# Patient Record
Sex: Male | Born: 1970 | Race: White | Hispanic: No | Marital: Single | Smoking: Current every day smoker
Health system: Southern US, Community
[De-identification: ages and names within clinical notes are randomized; demographics above are authoritative.]

---

## 2020-08-11 ENCOUNTER — Other Ambulatory Visit: Payer: Self-pay

## 2020-08-11 ENCOUNTER — Emergency Department (HOSPITAL_COMMUNITY): Payer: Federal, State, Local not specified - PPO

## 2020-08-11 ENCOUNTER — Emergency Department (HOSPITAL_COMMUNITY)
Admission: EM | Admit: 2020-08-11 | Discharge: 2020-08-11 | Disposition: A | Discharge status: Home / Self Care | Payer: Federal, State, Local not specified - PPO | Attending: Emergency Medicine | Admitting: Emergency Medicine

## 2020-08-11 DIAGNOSIS — R6884 Jaw pain: Secondary | ICD-10-CM | POA: Insufficient documentation

## 2020-08-11 DIAGNOSIS — N200 Calculus of kidney: Secondary | ICD-10-CM | POA: Insufficient documentation

## 2020-08-11 DIAGNOSIS — Z113 Encounter for screening for infections with a predominantly sexual mode of transmission: Secondary | ICD-10-CM | POA: Diagnosis not present

## 2020-08-11 DIAGNOSIS — M279 Disease of jaws, unspecified: Secondary | ICD-10-CM

## 2020-08-11 DIAGNOSIS — Z7689 Persons encountering health services in other specified circumstances: Secondary | ICD-10-CM

## 2020-08-11 LAB — URINALYSIS, ROUTINE W REFLEX MICROSCOPIC
Bilirubin Urine: NEGATIVE
Glucose, UA: NEGATIVE mg/dL
Hgb urine dipstick: NEGATIVE
Ketones, ur: NEGATIVE mg/dL
Leukocytes,Ua: NEGATIVE
Nitrite: NEGATIVE
Protein, ur: NEGATIVE mg/dL
Specific Gravity, Urine: 1.019 (ref 1.005–1.030)
pH: 5 (ref 5.0–8.0)

## 2020-08-11 LAB — CBC WITH DIFFERENTIAL/PLATELET
Abs Immature Granulocytes: 0.02 10*3/uL (ref 0.00–0.07)
Basophils Absolute: 0 10*3/uL (ref 0.0–0.1)
Basophils Relative: 1 %
Eosinophils Absolute: 0.1 10*3/uL (ref 0.0–0.5)
Eosinophils Relative: 1 %
HCT: 46.2 % (ref 39.0–52.0)
Hemoglobin: 15.6 g/dL (ref 13.0–17.0)
Immature Granulocytes: 0 %
Lymphocytes Relative: 27 %
Lymphs Abs: 2.4 10*3/uL (ref 0.7–4.0)
MCH: 29.4 pg (ref 26.0–34.0)
MCHC: 33.8 g/dL (ref 30.0–36.0)
MCV: 87.2 fL (ref 80.0–100.0)
Monocytes Absolute: 0.8 10*3/uL (ref 0.1–1.0)
Monocytes Relative: 9 %
Neutro Abs: 5.3 10*3/uL (ref 1.7–7.7)
Neutrophils Relative %: 62 %
Platelets: 241 10*3/uL (ref 150–400)
RBC: 5.3 MIL/uL (ref 4.22–5.81)
RDW: 12.8 % (ref 11.5–15.5)
WBC: 8.6 10*3/uL (ref 4.0–10.5)
nRBC: 0 % (ref 0.0–0.2)

## 2020-08-11 LAB — LIPASE, BLOOD: Lipase: 29 U/L (ref 11–51)

## 2020-08-11 LAB — COMPREHENSIVE METABOLIC PANEL
ALT: 14 U/L (ref 0–44)
AST: 19 U/L (ref 15–41)
Albumin: 4.2 g/dL (ref 3.5–5.0)
Alkaline Phosphatase: 71 U/L (ref 38–126)
Anion gap: 7 (ref 5–15)
BUN: 26 mg/dL — ABNORMAL HIGH (ref 6–20)
CO2: 27 mmol/L (ref 22–32)
Calcium: 9.1 mg/dL (ref 8.9–10.3)
Chloride: 102 mmol/L (ref 98–111)
Creatinine, Ser: 1.08 mg/dL (ref 0.61–1.24)
GFR, Estimated: >60 mL/min (ref >60)
Glucose, Bld: 100 mg/dL — ABNORMAL HIGH (ref 70–99)
Potassium: 3.7 mmol/L (ref 3.5–5.1)
Sodium: 136 mmol/L (ref 135–145)
Total Bilirubin: 0.7 mg/dL (ref 0.3–1.2)
Total Protein: 7.7 g/dL (ref 6.5–8.1)

## 2020-08-11 LAB — RPR: RPR Ser Ql: NONREACTIVE

## 2020-08-11 LAB — HIV ANTIBODY (ROUTINE TESTING W REFLEX): HIV Screen 4th Generation wRfx: NONREACTIVE

## 2020-08-11 IMAGING — CT CT RENAL STONE PROTOCOL
2 of 4 series · 16 of 46 positions shown, 18 images · non-contrast
Comparison: None.

CLINICAL DATA: Right-sided flank pain for 2 days with hematuria,
initial encounter

EXAM:
CT ABDOMEN AND PELVIS WITHOUT CONTRAST
TECHNIQUE: Multidetector CT imaging of the abdomen and pelvis was performed
following the standard protocol without IV contrast.

[Series 2: axial st · axial · 0.75mm/px · z∈[-901,-476]mm · 13 of 97 slices shown, 15 images]
[im 6/97  soft-tissue]
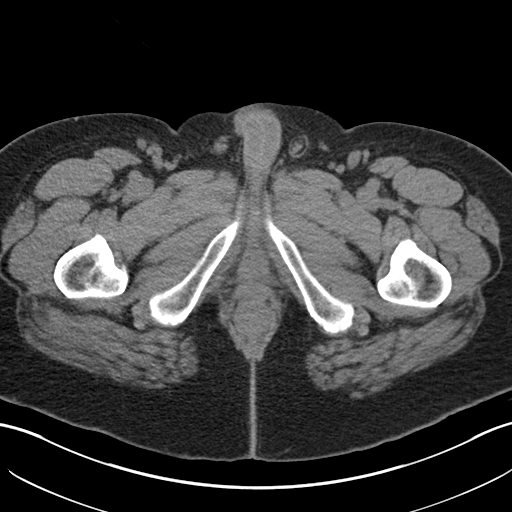
[im 6/97  bone]
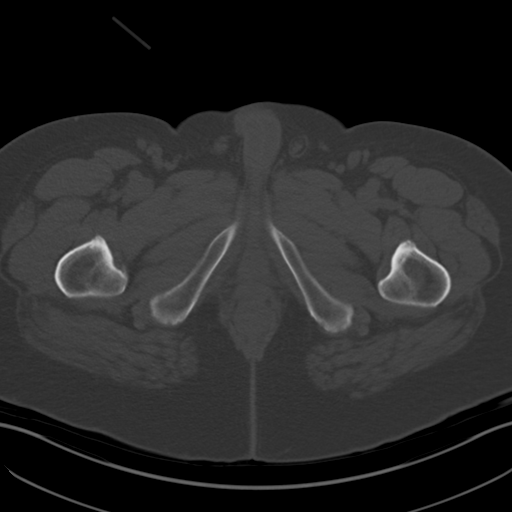
[im 16/97  soft-tissue]
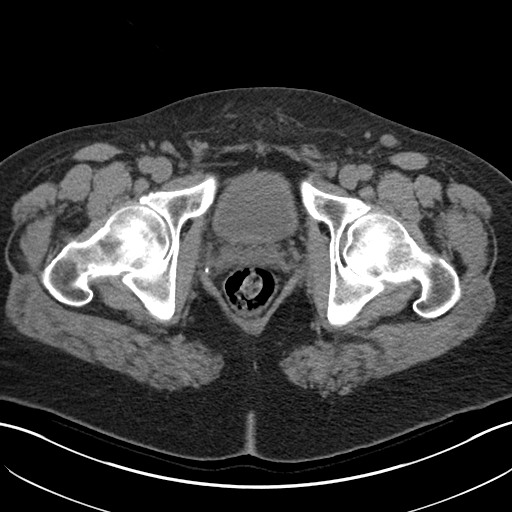
[im 21/97  soft-tissue]
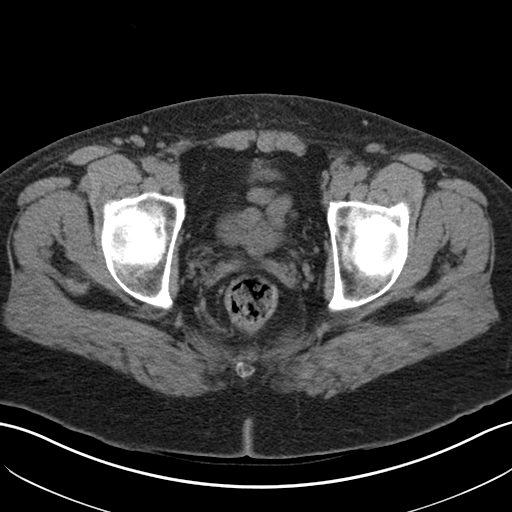
[im 26/97  soft-tissue]
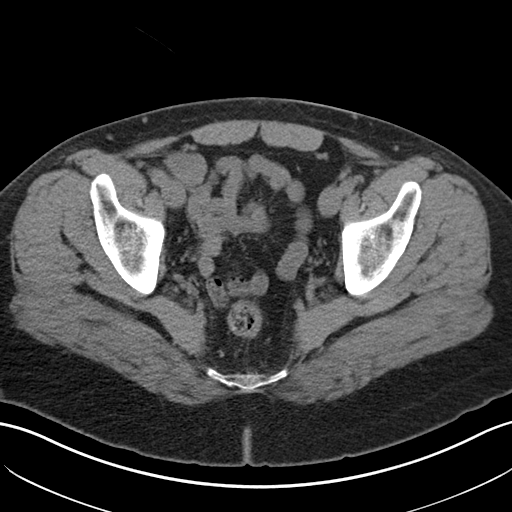
[im 36/97  soft-tissue]
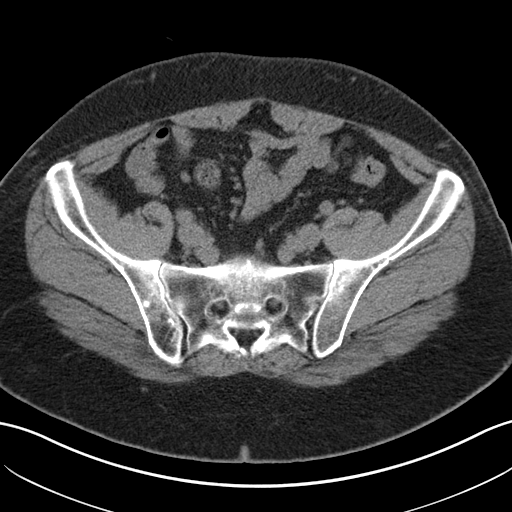
[im 41/97  soft-tissue]
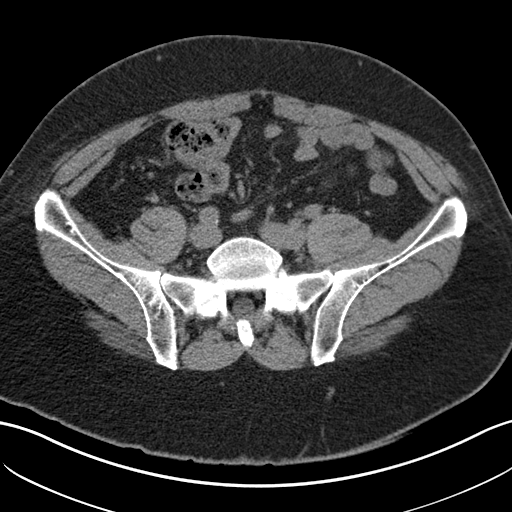
[im 51/97  soft-tissue]
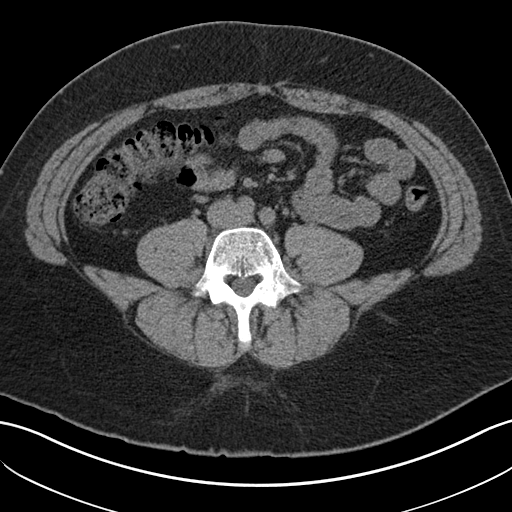
[im 56/97  soft-tissue]
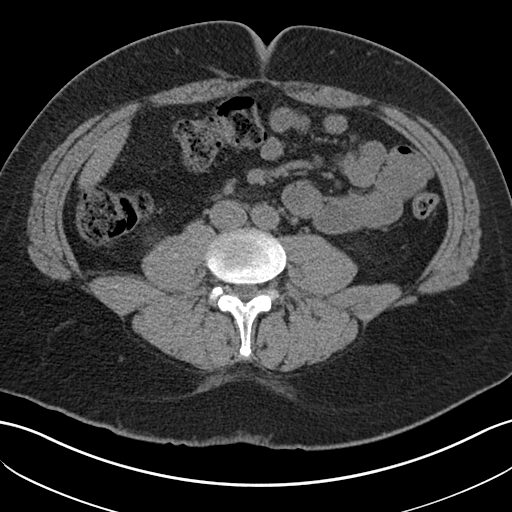
[im 61/97  soft-tissue]
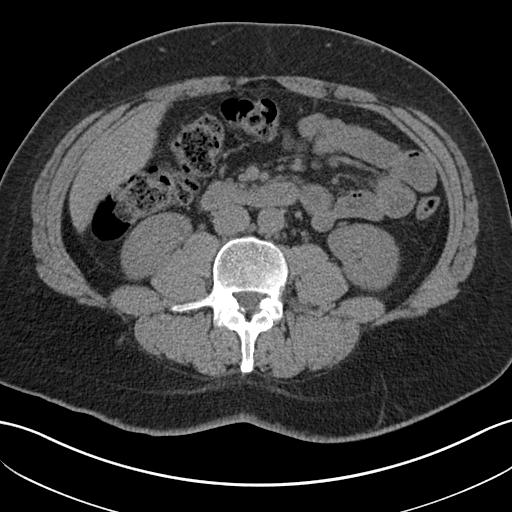
[im 61/97  bone]
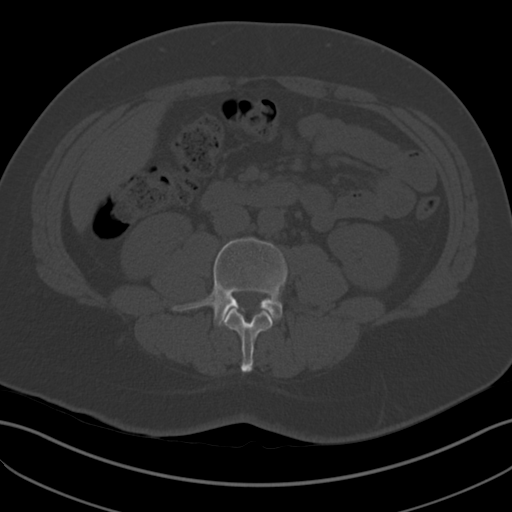
[im 71/97  soft-tissue]
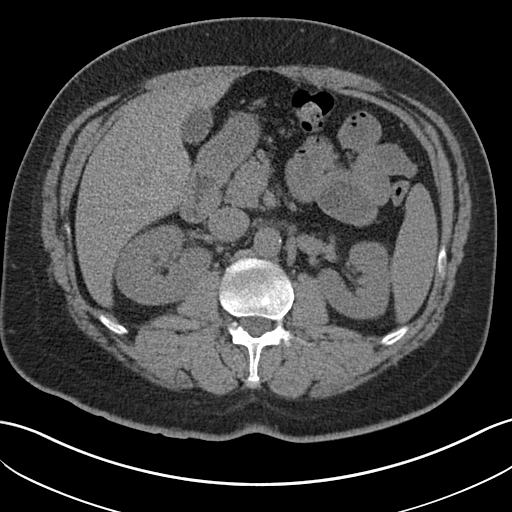
[im 76/97  soft-tissue]
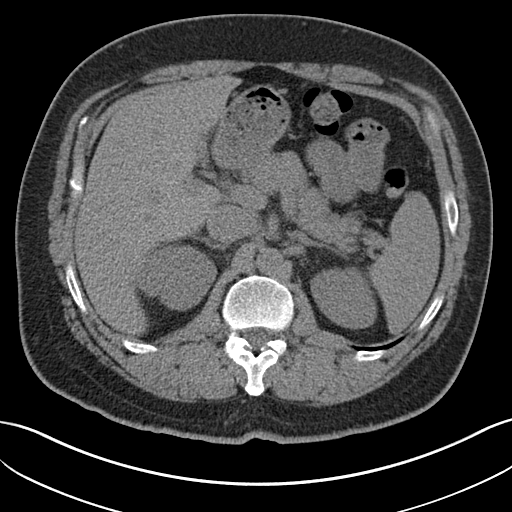
[im 81/97  soft-tissue]
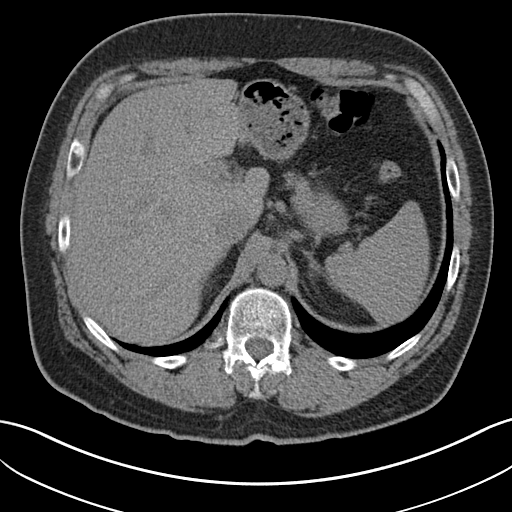
[im 91/97  soft-tissue]
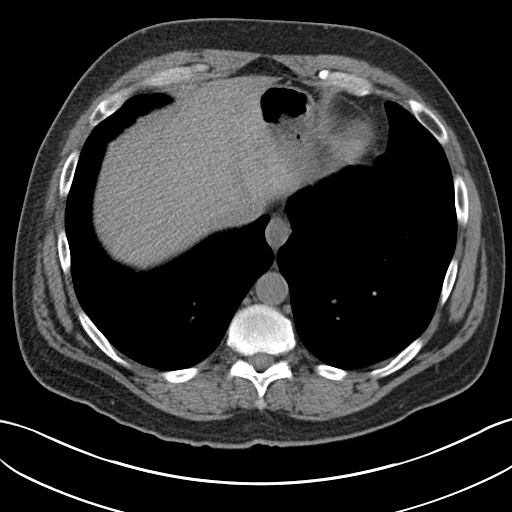

[Series 4: coronal · coronal · 0.81mm/px · 3 of 152 slices shown]
[im 51/152  soft-tissue]
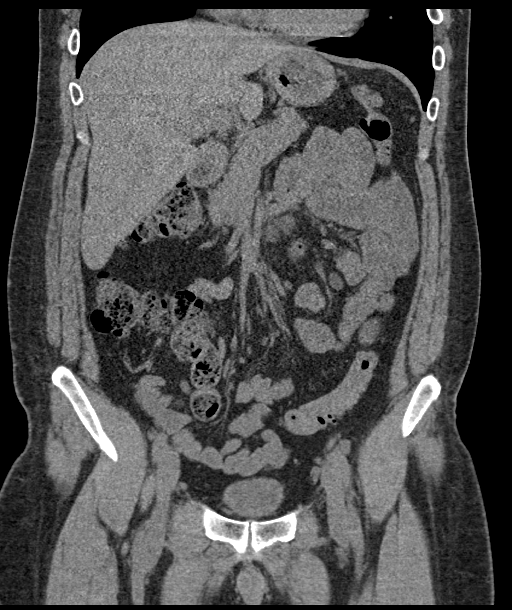
[im 68/152  soft-tissue]
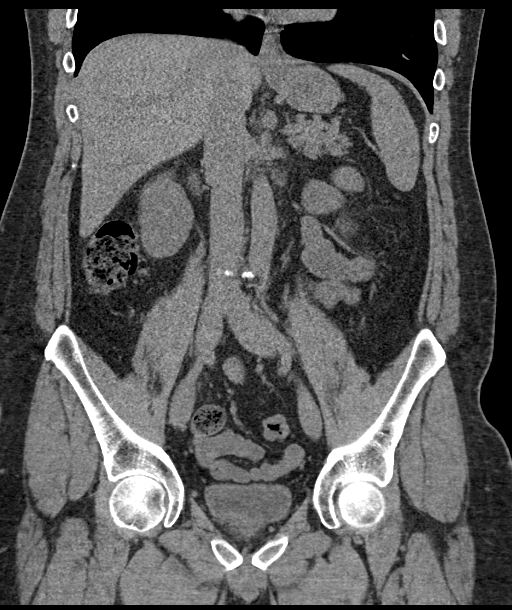
[im 84/152  soft-tissue]
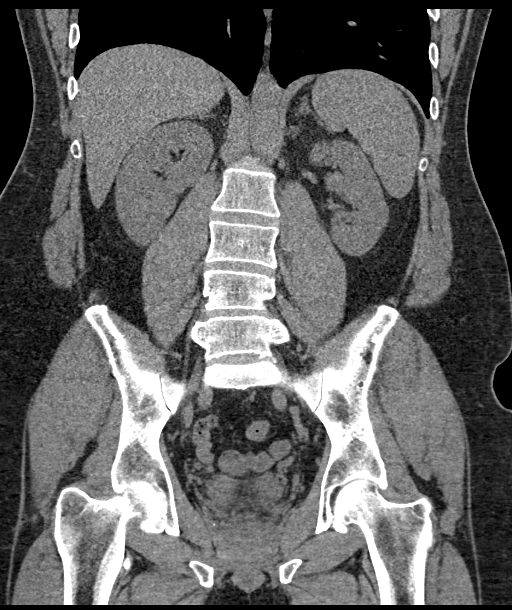

[16 of 46 positions shown; findings below may reference images not displayed]

FINDINGS: Lower chest: No acute abnormality.

Hepatobiliary: No focal liver abnormality is seen. No gallstones,
gallbladder wall thickening, or biliary dilatation.

Pancreas: Unremarkable. No pancreatic ductal dilatation or
surrounding inflammatory changes.

Spleen: Normal in size without focal abnormality.

Adrenals/Urinary Tract: Adrenal glands are within normal limits
bilaterally. Kidneys are well visualized with a tiny nonobstructing
stone on the left. Ureters are within normal limits. No obstructive
changes are seen. Bladder is partially distended.

Stomach/Bowel: Scattered diverticular change of the colon is noted
without evidence of diverticulitis. The appendix is well visualized.
Small bowel and stomach are within normal limits.

Vascular/Lymphatic: Mild atherosclerotic calcifications are noted.
No sizable lymphadenopathy is noted.

Reproductive: Prostate is well visualized. Metallic densities are
noted within likely related to prior uro lift procedure.

Other: No abdominal wall hernia or abnormality. No abdominopelvic
ascites.

Musculoskeletal: Degenerative changes of lumbar spine are noted. No
acute bony abnormality is seen.
IMPRESSION: Tiny nonobstructing left renal stone.

Diverticulosis without diverticulitis.

No acute abnormality noted.

## 2020-08-11 IMAGING — CT CT NECK W/O CM
3 of 4 series · 13 of 33 positions shown, 16 images · non-contrast
Comparison: None.

CLINICAL DATA: Deep tissue submandibular swelling, technologist
note states area neck that feels hard/swollen under chin

EXAM:
CT NECK WITHOUT CONTRAST
TECHNIQUE: Multidetector CT imaging of the neck was performed following the
standard protocol without intravenous contrast.

[Series 2: axial neck · axial · 0.51mm/px · z∈[-272,-106]mm · 5 of 125 slices shown, 7 images]
[im 21/125  soft-tissue]
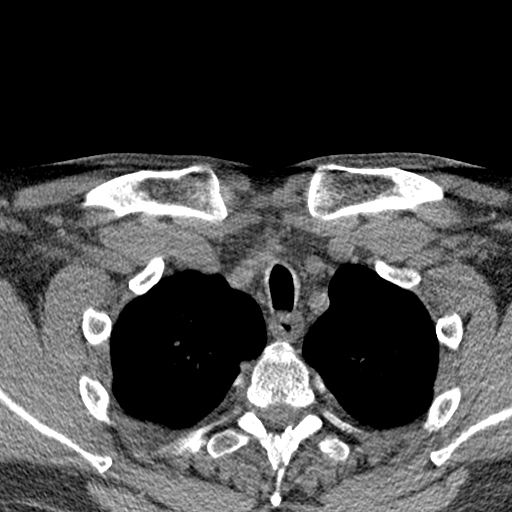
[im 21/125  bone]
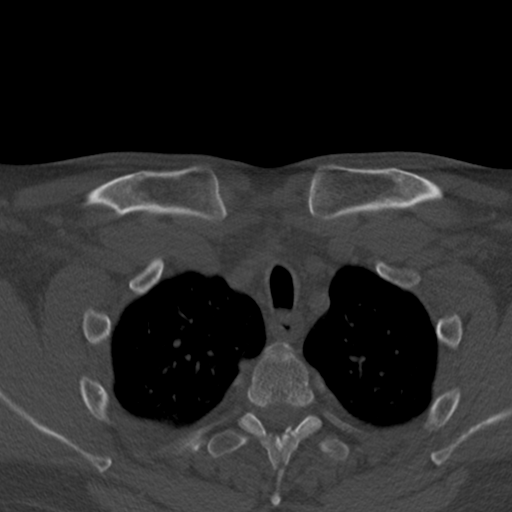
[im 42/125  bone]
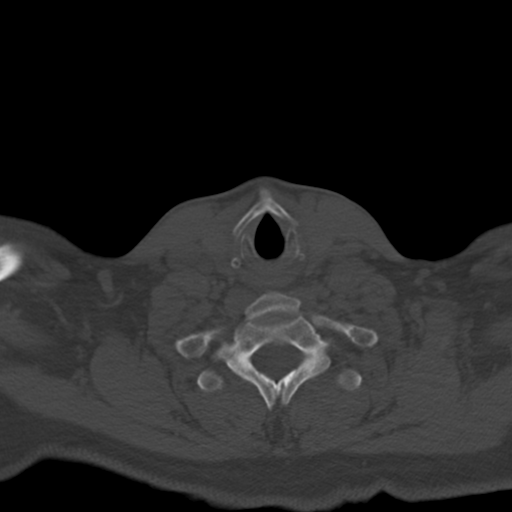
[im 63/125  bone]
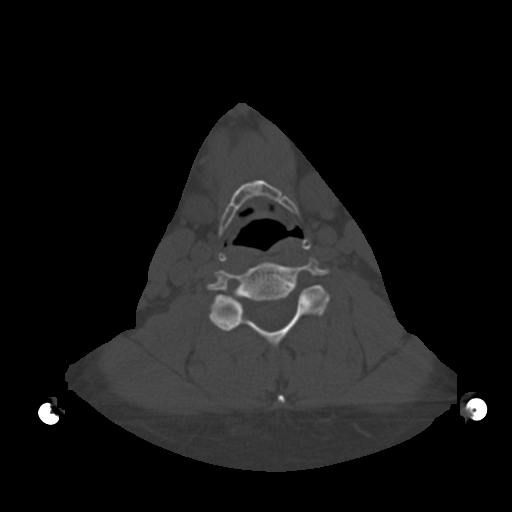
[im 83/125  bone]
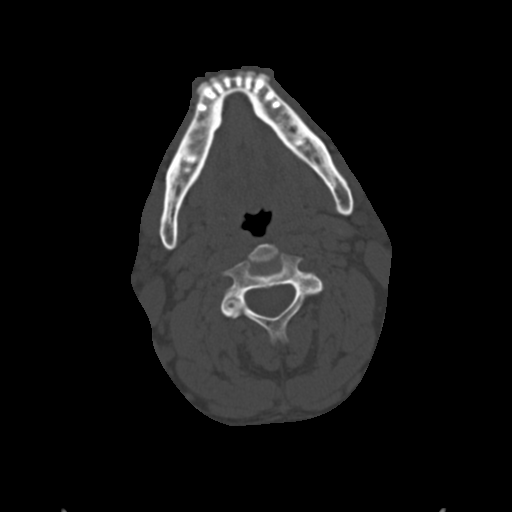
[im 104/125  soft-tissue]
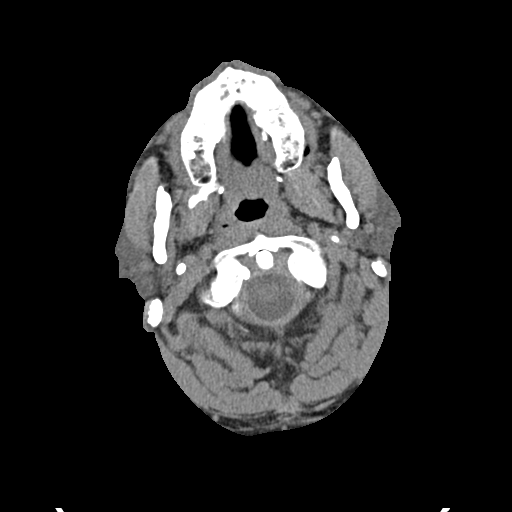
[im 104/125  bone]
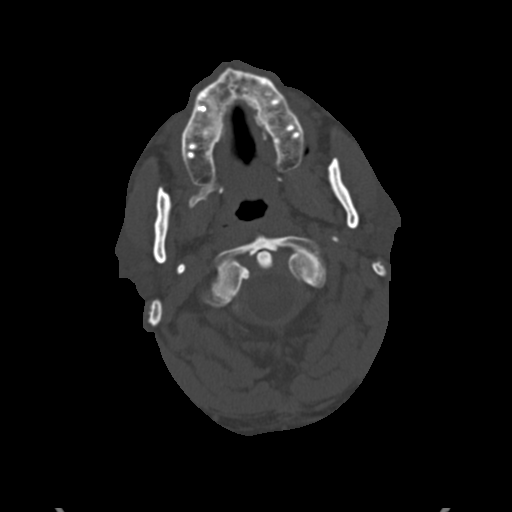

[Series 5: cor neck · coronal · 0.54mm/px · 3 of 99 slices shown]
[im 23/99  bone]
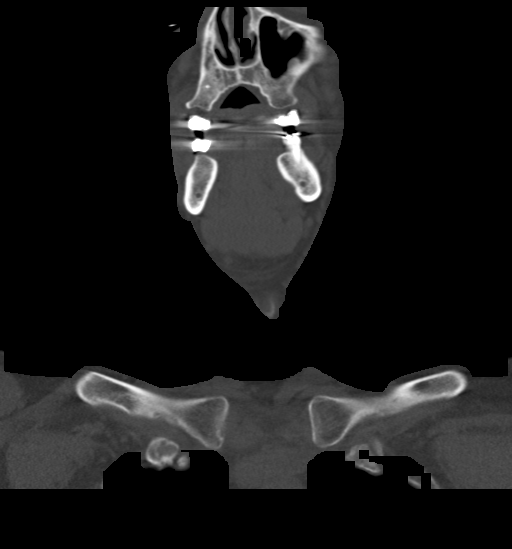
[im 41/99  bone]
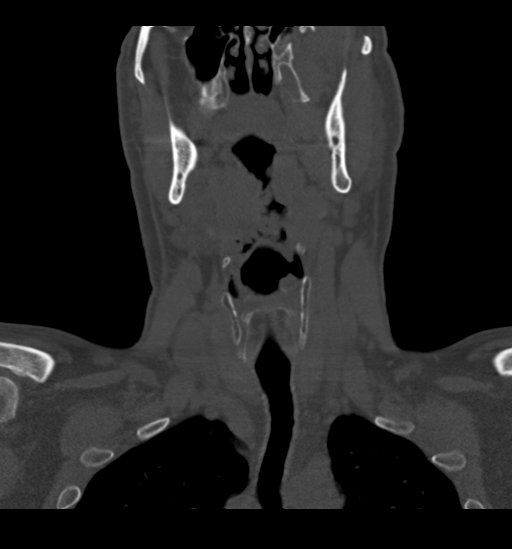
[im 58/99  bone]
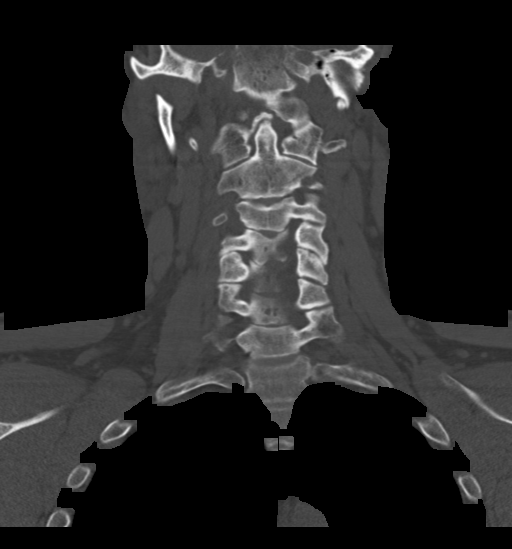

[Series 6: sag neck · sagittal · 0.38mm/px · 5 of 101 slices shown, 6 images]
[im 34/101  bone]
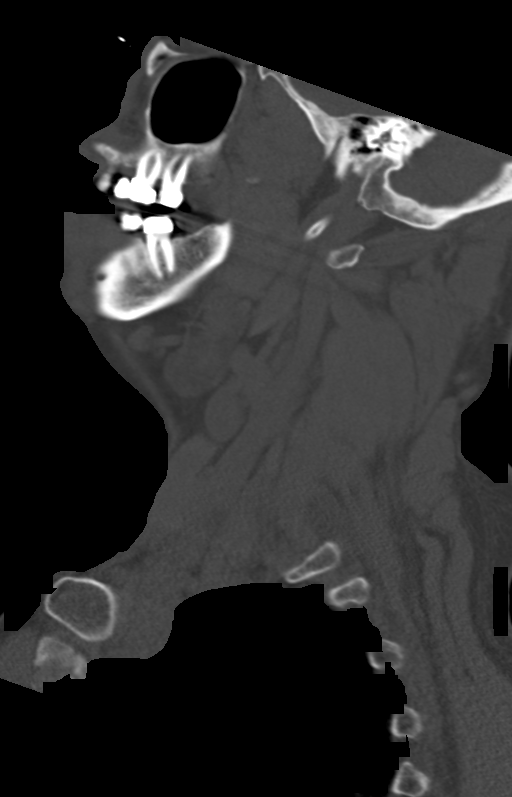
[im 42/101  bone]
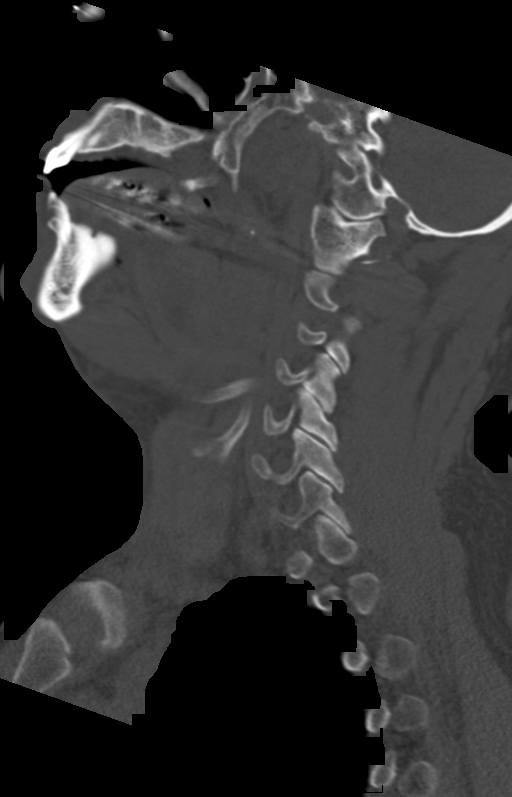
[im 51/101  soft-tissue]
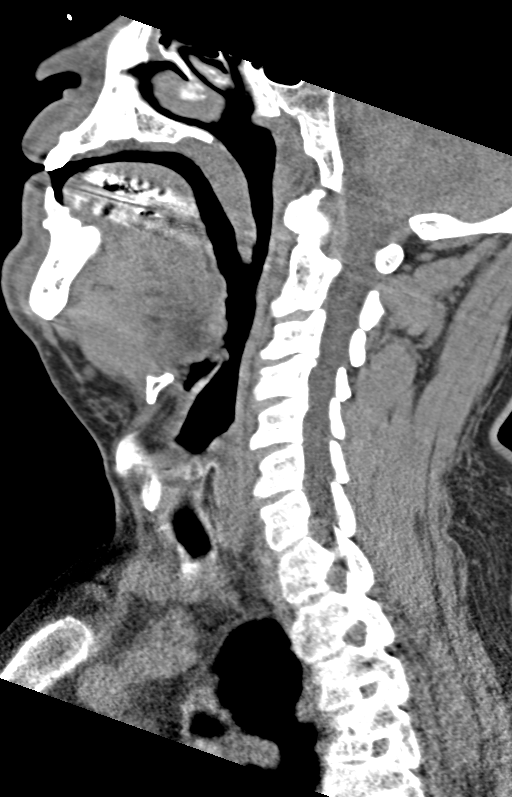
[im 51/101  bone]
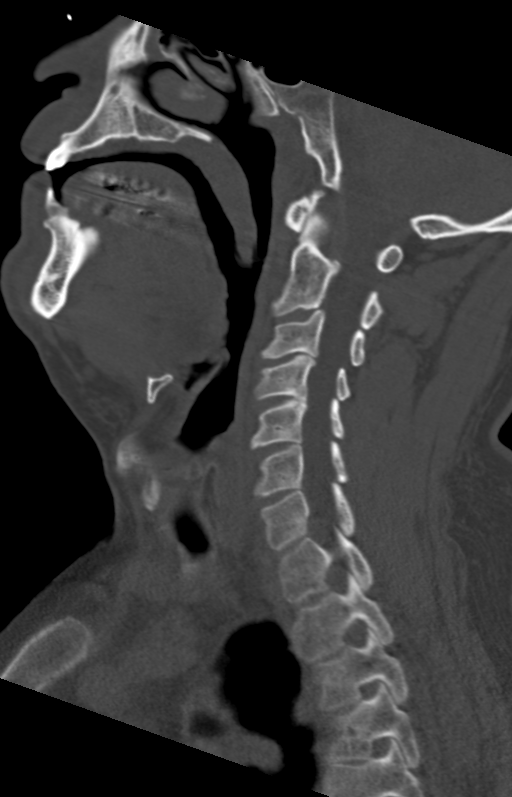
[im 59/101  bone]
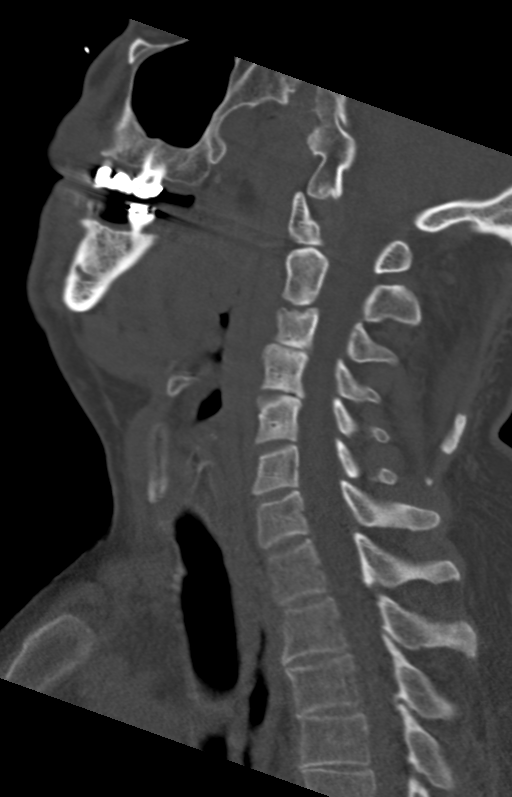
[im 67/101  bone]
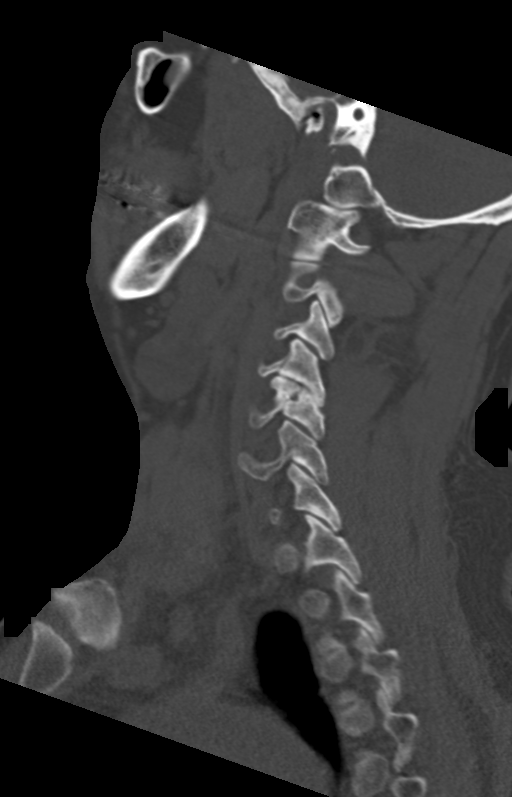

[13 of 33 positions shown; findings below may reference images not displayed]

FINDINGS: Pharynx and larynx: Unremarkable.

Salivary glands: Parotid and submandibular glands are unremarkable.

Thyroid: Normal.

Lymph nodes: No enlarged or abnormal density nodes.

Vascular: No significant abnormality on this noncontrast study.

Limited intracranial: Unremarkable.

Visualized orbits: Minimally imaged.

Mastoids and visualized paranasal sinuses: Minor left maxillary
sinus polypoid mucosal thickening. Visualized mastoids are clear.
There is a right stapes prosthesis.

Skeleton: Minor cervical spine degenerative changes.

Upper chest: Included upper lungs are clear.

Other: Skin marker overlies the chin. There is mild infiltration of
the submental subcutaneous fat including deep to the platysma.
IMPRESSION: Nonspecific mild infiltration of the submental subcutaneous fat
underlying the skin marker may reflect inflammatory change. No mass.

## 2020-08-11 MED ORDER — CEPHALEXIN 500 MG PO CAPS: 500.0000 mg | ORAL_CAPSULE | Freq: Four times a day (QID) | ORAL | 0 refills | Status: AC

## 2020-08-11 MED ORDER — DEXAMETHASONE 1 MG/ML PO CONC
10.0000 mg | Freq: Once | ORAL | Status: AC
  Administered 2020-08-11: 10 mg via ORAL
  Filled 2020-08-11: qty 10

## 2020-08-11 MED ORDER — METRONIDAZOLE 500 MG PO TABS: 500.0000 mg | ORAL_TABLET | Freq: Three times a day (TID) | ORAL | 0 refills | Status: AC

## 2020-08-11 MED ORDER — METRONIDAZOLE 500 MG PO TABS
500.0000 mg | ORAL_TABLET | Freq: Once | ORAL | Status: AC
  Administered 2020-08-11: 500 mg via ORAL
  Filled 2020-08-11: qty 1

## 2020-08-11 MED ORDER — CEPHALEXIN 500 MG PO CAPS
500.0000 mg | ORAL_CAPSULE | Freq: Once | ORAL | Status: AC
  Administered 2020-08-11: 500 mg via ORAL
  Filled 2020-08-11: qty 1

## 2020-08-11 NOTE — ED Notes (Signed)
PA-C at the bedside to evaluate.  

## 2020-08-11 NOTE — ED Provider Notes (Signed)
[facility]-EMERGENCY DEPT Provider Note   CSN: [id number] Arrival date & time: 08/11/20  8309     History Chief Complaint  Patient presents with  . Neck Pain  . Flank Pain    Richard Ewing is a 50 y.o. male history of obesity otherwise healthy presents today for evaluation of 2 complaints.  Patient's primary complaint today is submandibular swelling onset 24 hours ago described as aching pressure and constant worsening nonradiating worsened by palpation no alleviating factors.  Patient denies similar in the past.  Patient is concerned for an abscess of his jaw.  Patient's second concern today is right flank pain onset 24 hours ago described as sharp stabbing pain nonradiating no clear aggravating or alleviating factors.  Patient reports this is associated with dysuria that he has been experiencing for 1 week.  Of note patient reports that he was sexually active with a new partner around 1 week ago without protection, patient reports he developed balanitis after that encounter which resolved with use of Monistat.  Patient is concerned for an STI and or kidney stone  Denies fever/chills, fall/injury, numbness/tingling, weakness, nausea/vomiting, testicular pain/swelling, genital rash/lesion, difficulty swallowing, difficulty speaking, pain with neck movement, sore throat, dental pain or any additional concerns. HPI     No past medical history on file.  There are no problems to display for this patient.        No family history on file.     Home Medications Prior to Admission medications   Medication Sig Start Date End Date Taking? Authorizing Provider  cephALEXin (KEFLEX) 500 MG capsule Take 1 capsule (500 mg total) by mouth 4 (four) times daily for 10 days. 08/11/20 08/21/20 Yes Harlene Salts A, PA-C  metroNIDAZOLE (FLAGYL) 500 MG tablet Take 1 tablet (500 mg total) by mouth 3 (three) times daily for 10 days. 08/11/20 08/21/20 Yes Harlene Salts A, PA-C     Allergies    Erythromycin and Shellfish allergy  Review of Systems   Review of Systems Ten systems are reviewed and are negative for acute change except as noted in the HPI  Physical Exam Updated Vital Signs BP (!) 151/105   Pulse 63   Temp 98.4 F (36.9 C) (Oral)   Resp 18   Ht 6\' 6"  (1.981 m)   Wt 129.3 kg   SpO2 100%   BMI 32.94 kg/m   Physical Exam Constitutional:      General: He is not in acute distress.    Appearance: Normal appearance. He is well-developed. He is not ill-appearing or diaphoretic.  HENT:     Head: Normocephalic and atraumatic.     Jaw: There is normal jaw occlusion.     Nose: Nose normal.     Mouth/Throat:     Comments: Poor dentition overall.  Mild TTP of the submandibular area.  The patient has normal phonation and is in control of secretions. No stridor.  Midline uvula without edema. Soft palate rises symmetrically. No tonsillar erythema, swelling or exudates. Tongue protrusion is normal, floor of mouth is soft. No trismus. No creptius on neck palpation. No gingival erythema or fluctuance noted. Mucus membranes moist. No pallor noted. Eyes:     General: Vision grossly intact. Gaze aligned appropriately.     Extraocular Movements: Extraocular movements intact.     Conjunctiva/sclera: Conjunctivae normal.     Pupils: Pupils are equal, round, and reactive to light.  Neck:     Trachea: Trachea and phonation normal.  Cardiovascular:  Pulses:          Dorsalis pedis pulses are 2+ on the right side and 2+ on the left side.  Pulmonary:     Effort: Pulmonary effort is normal. No respiratory distress.  Abdominal:     General: There is no distension.     Palpations: Abdomen is soft.     Tenderness: There is no abdominal tenderness. There is no guarding or rebound.  Musculoskeletal:        General: Normal range of motion.     Cervical back: Normal range of motion.     Comments: No midline C/T/L spinal tenderness to palpation,  no deformity,  crepitus, or step-off noted. No sign of injury to the neck or back.  Mild right lumbar paraspinal TTP without overlying skin changes  Feet:     Right foot:     Protective Sensation: 3 sites tested. 3 sites sensed.     Left foot:     Protective Sensation: 3 sites tested. 3 sites sensed.  Skin:    General: Skin is warm and dry.  Neurological:     Mental Status: He is alert.     GCS: GCS eye subscore is 4. GCS verbal subscore is 5. GCS motor subscore is 6.     Comments: Speech is clear and goal oriented, follows commands Major Cranial nerves without deficit, no facial droop Moves extremities without ataxia, coordination intact  Psychiatric:        Behavior: Behavior normal.     ED Results / Procedures / Treatments   Labs (all labs ordered are listed, but only abnormal results are displayed) Labs Reviewed  COMPREHENSIVE METABOLIC PANEL - Abnormal; Notable for the following components:      Result Value   Glucose, Bld 100 (*)    BUN 26 (*)    All other components within normal limits  URINE CULTURE  CBC WITH DIFFERENTIAL/PLATELET  LIPASE, BLOOD  URINALYSIS, ROUTINE W REFLEX MICROSCOPIC  RPR  HIV ANTIBODY (ROUTINE TESTING W REFLEX)  GC/CHLAMYDIA PROBE AMP ([place]) NOT AT [facility]    EKG None  Radiology CT Soft Tissue Neck Wo Contrast  Result Date: 08/11/2020 CLINICAL DATA:  Deep tissue submandibular swelling, technologist note states area neck that feels hard/swollen under chin EXAM: CT NECK WITHOUT CONTRAST TECHNIQUE: Multidetector CT imaging of the neck was performed following the standard protocol without intravenous contrast. COMPARISON:  None. FINDINGS: Pharynx and larynx: Unremarkable. Salivary glands: Parotid and submandibular glands are unremarkable. Thyroid: Normal. Lymph nodes: No enlarged or abnormal density nodes. Vascular: No significant abnormality on this noncontrast study. Limited intracranial: Unremarkable. Visualized orbits: Minimally imaged. Mastoids and  visualized paranasal sinuses: Minor left maxillary sinus polypoid mucosal thickening. Visualized mastoids are clear. 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 Skeleton: Minor cervical spine degenerative changes. Upper chest: Included upper lungs are clear. Other: Skin marker overlies the chin. There is mild infiltration of the submental subcutaneous fat including deep to the platysma. IMPRESSION: Nonspecific mild infiltration of the submental subcutaneous fat underlying the skin marker may reflect inflammatory change. No mass. Electronically Signed   By: Guadlupe Spanish M.D.   On: 08/11/2020 08:31   CT Renal Stone Study  Result Date: 08/11/2020 CLINICAL DATA:  Right-sided flank pain for 2 days with hematuria, initial encounter EXAM: CT ABDOMEN AND PELVIS WITHOUT CONTRAST TECHNIQUE: Multidetector CT imaging of the abdomen and pelvis was performed following the standard protocol without IV contrast. COMPARISON:  None. FINDINGS: Lower chest: No acute abnormality. Hepatobiliary: No focal  liver abnormality is seen. No gallstones, gallbladder wall thickening, or biliary dilatation. Pancreas: Unremarkable. No pancreatic ductal dilatation or surrounding inflammatory changes. Spleen: Normal in size without focal abnormality. Adrenals/Urinary Tract: Adrenal glands are within normal limits bilaterally. Kidneys are well visualized with a tiny nonobstructing stone on the left. Ureters are within normal limits. No obstructive changes are seen. Bladder is partially distended. Stomach/Bowel: Scattered diverticular change of the colon is noted without evidence of diverticulitis. The appendix is well visualized. Small bowel and stomach are within normal limits. Vascular/Lymphatic: Mild atherosclerotic calcifications are noted. No sizable lymphadenopathy is noted. Reproductive: Prostate is well visualized. Metallic densities are noted within likely related to prior uro lift procedure. Other: No abdominal wall hernia or abnormality. No  abdominopelvic ascites. Musculoskeletal: Degenerative changes of lumbar spine are noted. No acute bony abnormality is seen. IMPRESSION: Tiny nonobstructing left renal stone. Diverticulosis without diverticulitis. No acute abnormality noted. Electronically Signed   By: Alcide Clever M.D.   On: 08/11/2020 08:12    Procedures Procedures   Medications Ordered in ED Medications  dexamethasone (DECADRON) 1 MG/ML solution 10 mg (has no administration in time range)  metroNIDAZOLE (FLAGYL) tablet 500 mg (has no administration in time range)  cephALEXin (KEFLEX) capsule 500 mg (500 mg Oral Given 08/11/20 0936)    ED Course  I have reviewed the triage vital signs and the nursing notes.  Pertinent labs & imaging results that were available during my care of the patient were reviewed by me and considered in my medical decision making (see chart for details).  Clinical Course as of 08/11/20 1131  Tue Aug 11, 2020  4010 Keflex [BM]    Clinical Course User Index [BM] Elizabeth Palau   MDM Rules/Calculators/A&P                         Additional history obtained from: 1. Nursing notes from this visit. -----------------------   I ordered, reviewed and interpreted labs which include: Urinalysis within normal limits, no bacteria or trichomonas Lipase within normal limits. CMP shows no emergent electrolyte derangement, AKI, LFT elevations or gap. CBC shows no leukocytosis, or thrombocytopenia  HIV, RPR and GC chlamydia are pending.  CT Soft Tissue Neck:    IMPRESSION:  Nonspecific mild infiltration of the submental subcutaneous fat  underlying the skin marker may reflect inflammatory change. No mass.   CT Renal Stone Study:  IMPRESSION:  Tiny nonobstructing left renal stone.    Diverticulosis without diverticulitis.    No acute abnormality noted.  -------------------------- Plan of care is to start patient on Keflex for treatment of nonspecific inflammation in the submental  area.  No abscess to indicate emergent ENT involvement at this time.  Will have patient follow-up outpatient with ENT and return to the ER for any new or worsening symptoms.  Patient was given first dose of Keflex and Flagyl here in the ER.  Patient states understanding of care plan and is agreeable for discharge and outpatient management.  Consult was called to ENT Dr. Marene Lenz reviewed patient's case and CT scan.  Advised giving patient 10 mg p.o. Decadron here and have patient follow-up outpatient.  Agrees with Keflex/Flagyl or clindamycin.  As to patient's right flank pain he does have some muscular tenderness on exam question muscular etiology of his symptoms today.  He denies any fall injury midline spinal pain and has no neurologic symptoms or red flags to suggest spinal dural abscess, cauda equina, AAA or other  emergent etiologies of right lower back pain.  Patient does have a small left-sided renal stone, possibly he may have passed a previous right-sided stone causing pain.  Will refer patient to urology for a follow-up of this.  He refused GU examination today sites no testicular pain or lesions or concerns.  Patient will follow up on his HIV, RPR and GC chlamydia test on his MyChart account and seek treatment if those are positive.  Patient will abstain from sex until results, encouraged to use safe sex.   At this time there does not appear to be any evidence of an acute emergency medical condition and the patient appears stable for discharge with appropriate outpatient follow up. Diagnosis was discussed with patient who verbalizes understanding of care plan and is agreeable to discharge. I have discussed return precautions with patient who verbalizes understanding. Patient encouraged to follow-up with their PCP, ENT and urology. All questions answered.  Patient seen and evaluated by Dr. Rhunette CroftNanavati during this visit, agrees with discharge with Keflex and outpatient ENT follow-up.  Note:  Portions of this report may have been transcribed using voice recognition software. Every effort was made to ensure accuracy; however, inadvertent computerized transcription errors may still be present.  Final Clinical Impression(s) / ED Diagnoses Final diagnoses:  Discomfort of submandibular region  Kidney stone  Encounter for assessment of sexually transmitted disease exposure    Rx / DC Orders ED Discharge Orders         Ordered    cephALEXin (KEFLEX) 500 MG capsule  4 times daily        08/11/20 1130    metroNIDAZOLE (FLAGYL) 500 MG tablet  3 times daily        08/11/20 1130           Elizabeth PalauMorelli, Lugenia Assefa A, PA-C 08/11/20 1131    Derwood KaplanNanavati, Ankit, MD 08/12/20 1814

## 2020-08-11 NOTE — ED Notes (Signed)
Pt back from CT at this time. Ambulatory to the bathroom.

## 2020-08-11 NOTE — ED Triage Notes (Signed)
Pt c/o R sided flank pain and dysuria started yesterday. Pt also c/o an area on his neck that he states feels hard/swollen that he noticed this morning. Denies fevers

## 2020-08-11 NOTE — ED Notes (Signed)
Pt to CT at this time.

## 2020-08-11 NOTE — Discharge Instructions (Signed)
At this time there does not appear to be the presence of an emergent medical condition, however there is always the potential for conditions to change. Please read and follow the below instructions.  Please return to the Emergency Department immediately for any new or worsening symptoms or if your symptoms do not improve within 2 days. Please be sure to follow up with your Primary Care Provider within one week regarding your visit today; please call their office to schedule an appointment even if you are feeling better for a follow-up visit. Please take the antibiotics Keflex and Flagyl as prescribed for treatment of the inflammation underneath your chin.  Please call the specialist at [facility] ear nose and throat to schedule a follow-up appointment for further evaluation and treatment of your symptoms.  If your symptoms worsen at any time do not wait for your follow-up appointment and instead return immediately to the emergency department for further evaluation and treatment.  Do not drink any alcohol while taking Flagyl as it will make you sick and vomit. Your gonorrhea, chlamydia, HIV and syphilis tests are currently pending.  You can review your results within the next 2 days on your MyChart account online.  If any of your tests are positive you will need further treatment which you can seek at either your primary care provider's office, the health department or if needed here at the emergency department.  Please abstain from sexual activity until the results of your test are available and are negative.  If any of your tests are positive please inform all of your sexual partners that they need testing and treatment as well.  Please use safe sex every time. Your urine culture is currently in process.  If it grows out any bacteria that require antibiotic treatment you will be contacted by [facility] health.  Go to the nearest Emergency Department immediately if: You have fever or chills You get very bad  pain. You get new pain in your belly (abdomen). You pass out (faint). You cannot pee. You have a very bad headache. You have problems breathing or swallowing. You have trouble opening your mouth. You have swelling in your neck or close to your eye. You have any new/concerning or worsening of symptoms   Please read the additional information packets attached to your discharge summary.  Do not take your medicine if  develop an itchy rash, swelling in your mouth or lips, or difficulty breathing; call 911 and seek immediate emergency medical attention if this occurs.  You may review your lab tests and imaging results in their entirety on your MyChart account.  Please discuss all results of fully with your primary care provider and other specialist at your follow-up visit.  Note: Portions of this text may have been transcribed using voice recognition software. Every effort was made to ensure accuracy; however, inadvertent computerized transcription errors may still be present.

## 2020-08-12 LAB — URINE CULTURE: Culture: NO GROWTH

## 2020-08-12 LAB — GC/CHLAMYDIA PROBE AMP (~~LOC~~) NOT AT ARMC
Chlamydia: NEGATIVE
Comment: NEGATIVE
Comment: NORMAL
Neisseria Gonorrhea: NEGATIVE

## 2021-01-14 ENCOUNTER — Other Ambulatory Visit: Payer: Self-pay

## 2021-01-14 ENCOUNTER — Encounter (HOSPITAL_BASED_OUTPATIENT_CLINIC_OR_DEPARTMENT_OTHER): Payer: Self-pay

## 2021-01-14 ENCOUNTER — Emergency Department (HOSPITAL_BASED_OUTPATIENT_CLINIC_OR_DEPARTMENT_OTHER): Payer: Federal, State, Local not specified - PPO

## 2021-01-14 ENCOUNTER — Emergency Department (HOSPITAL_BASED_OUTPATIENT_CLINIC_OR_DEPARTMENT_OTHER)
Admission: EM | Admit: 2021-01-14 | Discharge: 2021-01-14 | Disposition: A | Discharge status: Home / Self Care | Payer: Federal, State, Local not specified - PPO | Attending: Student | Admitting: Student

## 2021-01-14 DIAGNOSIS — R21 Rash and other nonspecific skin eruption: Secondary | ICD-10-CM | POA: Insufficient documentation

## 2021-01-14 DIAGNOSIS — R0602 Shortness of breath: Secondary | ICD-10-CM | POA: Insufficient documentation

## 2021-01-14 DIAGNOSIS — Z20822 Contact with and (suspected) exposure to covid-19: Secondary | ICD-10-CM | POA: Diagnosis not present

## 2021-01-14 DIAGNOSIS — R0789 Other chest pain: Secondary | ICD-10-CM | POA: Diagnosis present

## 2021-01-14 DIAGNOSIS — R519 Headache, unspecified: Secondary | ICD-10-CM | POA: Insufficient documentation

## 2021-01-14 DIAGNOSIS — F17210 Nicotine dependence, cigarettes, uncomplicated: Secondary | ICD-10-CM | POA: Diagnosis not present

## 2021-01-14 LAB — COMPREHENSIVE METABOLIC PANEL
ALT: 12 U/L (ref 0–44)
AST: 18 U/L (ref 15–41)
Albumin: 3.9 g/dL (ref 3.5–5.0)
Alkaline Phosphatase: 60 U/L (ref 38–126)
Anion gap: 4 — ABNORMAL LOW (ref 5–15)
BUN: 20 mg/dL (ref 6–20)
CO2: 29 mmol/L (ref 22–32)
Calcium: 8.6 mg/dL — ABNORMAL LOW (ref 8.9–10.3)
Chloride: 104 mmol/L (ref 98–111)
Creatinine, Ser: 1.04 mg/dL (ref 0.61–1.24)
GFR, Estimated: >60 mL/min (ref >60)
Glucose, Bld: 130 mg/dL — ABNORMAL HIGH (ref 70–99)
Potassium: 3.4 mmol/L — ABNORMAL LOW (ref 3.5–5.1)
Sodium: 137 mmol/L (ref 135–145)
Total Bilirubin: 1.3 mg/dL — ABNORMAL HIGH (ref 0.3–1.2)
Total Protein: 7 g/dL (ref 6.5–8.1)

## 2021-01-14 LAB — CBC WITH DIFFERENTIAL/PLATELET
Abs Immature Granulocytes: 0.02 10*3/uL (ref 0.00–0.07)
Basophils Absolute: 0 10*3/uL (ref 0.0–0.1)
Basophils Relative: 1 %
Eosinophils Absolute: 0.1 10*3/uL (ref 0.0–0.5)
Eosinophils Relative: 2 %
HCT: 46.8 % (ref 39.0–52.0)
Hemoglobin: 15.9 g/dL (ref 13.0–17.0)
Immature Granulocytes: 0 %
Lymphocytes Relative: 30 %
Lymphs Abs: 1.9 10*3/uL (ref 0.7–4.0)
MCH: 29.8 pg (ref 26.0–34.0)
MCHC: 34 g/dL (ref 30.0–36.0)
MCV: 87.6 fL (ref 80.0–100.0)
Monocytes Absolute: 0.5 10*3/uL (ref 0.1–1.0)
Monocytes Relative: 8 %
Neutro Abs: 3.7 10*3/uL (ref 1.7–7.7)
Neutrophils Relative %: 59 %
Platelets: 213 10*3/uL (ref 150–400)
RBC: 5.34 MIL/uL (ref 4.22–5.81)
RDW: 12.8 % (ref 11.5–15.5)
WBC: 6.4 10*3/uL (ref 4.0–10.5)
nRBC: 0 % (ref 0.0–0.2)

## 2021-01-14 LAB — BRAIN NATRIURETIC PEPTIDE: B Natriuretic Peptide: 18.8 pg/mL (ref 0.0–100.0)

## 2021-01-14 LAB — URINALYSIS, ROUTINE W REFLEX MICROSCOPIC
Bilirubin Urine: NEGATIVE
Glucose, UA: NEGATIVE mg/dL
Hgb urine dipstick: NEGATIVE
Ketones, ur: NEGATIVE mg/dL
Leukocytes,Ua: NEGATIVE
Nitrite: NEGATIVE
Protein, ur: NEGATIVE mg/dL
Specific Gravity, Urine: 1.025 (ref 1.005–1.030)
pH: 7.5 (ref 5.0–8.0)

## 2021-01-14 LAB — RESP PANEL BY RT-PCR (FLU A&B, COVID) ARPGX2
Influenza A by PCR: NEGATIVE
Influenza B by PCR: NEGATIVE
SARS Coronavirus 2 by RT PCR: NEGATIVE

## 2021-01-14 LAB — URINALYSIS, MICROSCOPIC (REFLEX)

## 2021-01-14 LAB — TROPONIN I (HIGH SENSITIVITY)
Troponin I (High Sensitivity): 3 ng/L (ref <18)
Troponin I (High Sensitivity): 3 ng/L (ref <18)

## 2021-01-14 LAB — D-DIMER, QUANTITATIVE: D-Dimer, Quant: <0.27 ug/mL-FEU (ref 0.00–0.50)

## 2021-01-14 IMAGING — DX DG CHEST 1V PORT
2 series · 2 of 2 positions shown · non-contrast
Comparison: None.

CLINICAL DATA: chest pain

EXAM:
PORTABLE CHEST 1 VIEW

[chest ap (1 of 2)]
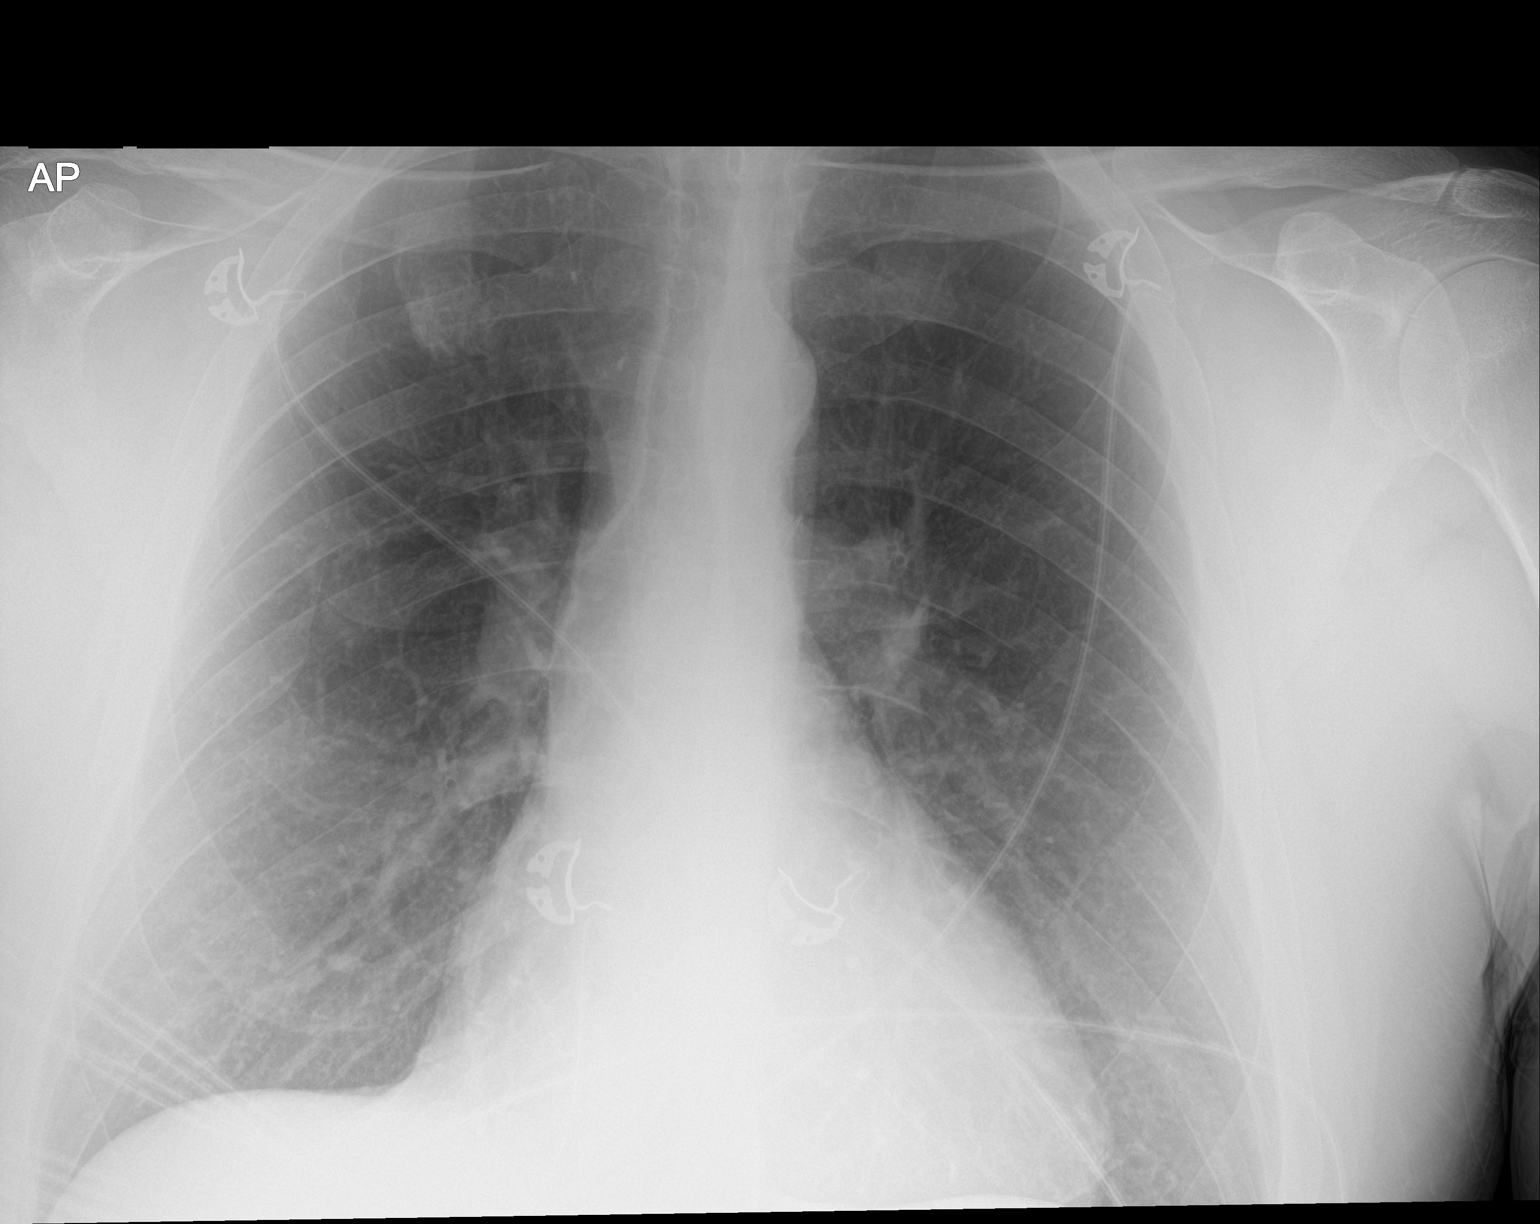

[chest ap (2 of 2)]
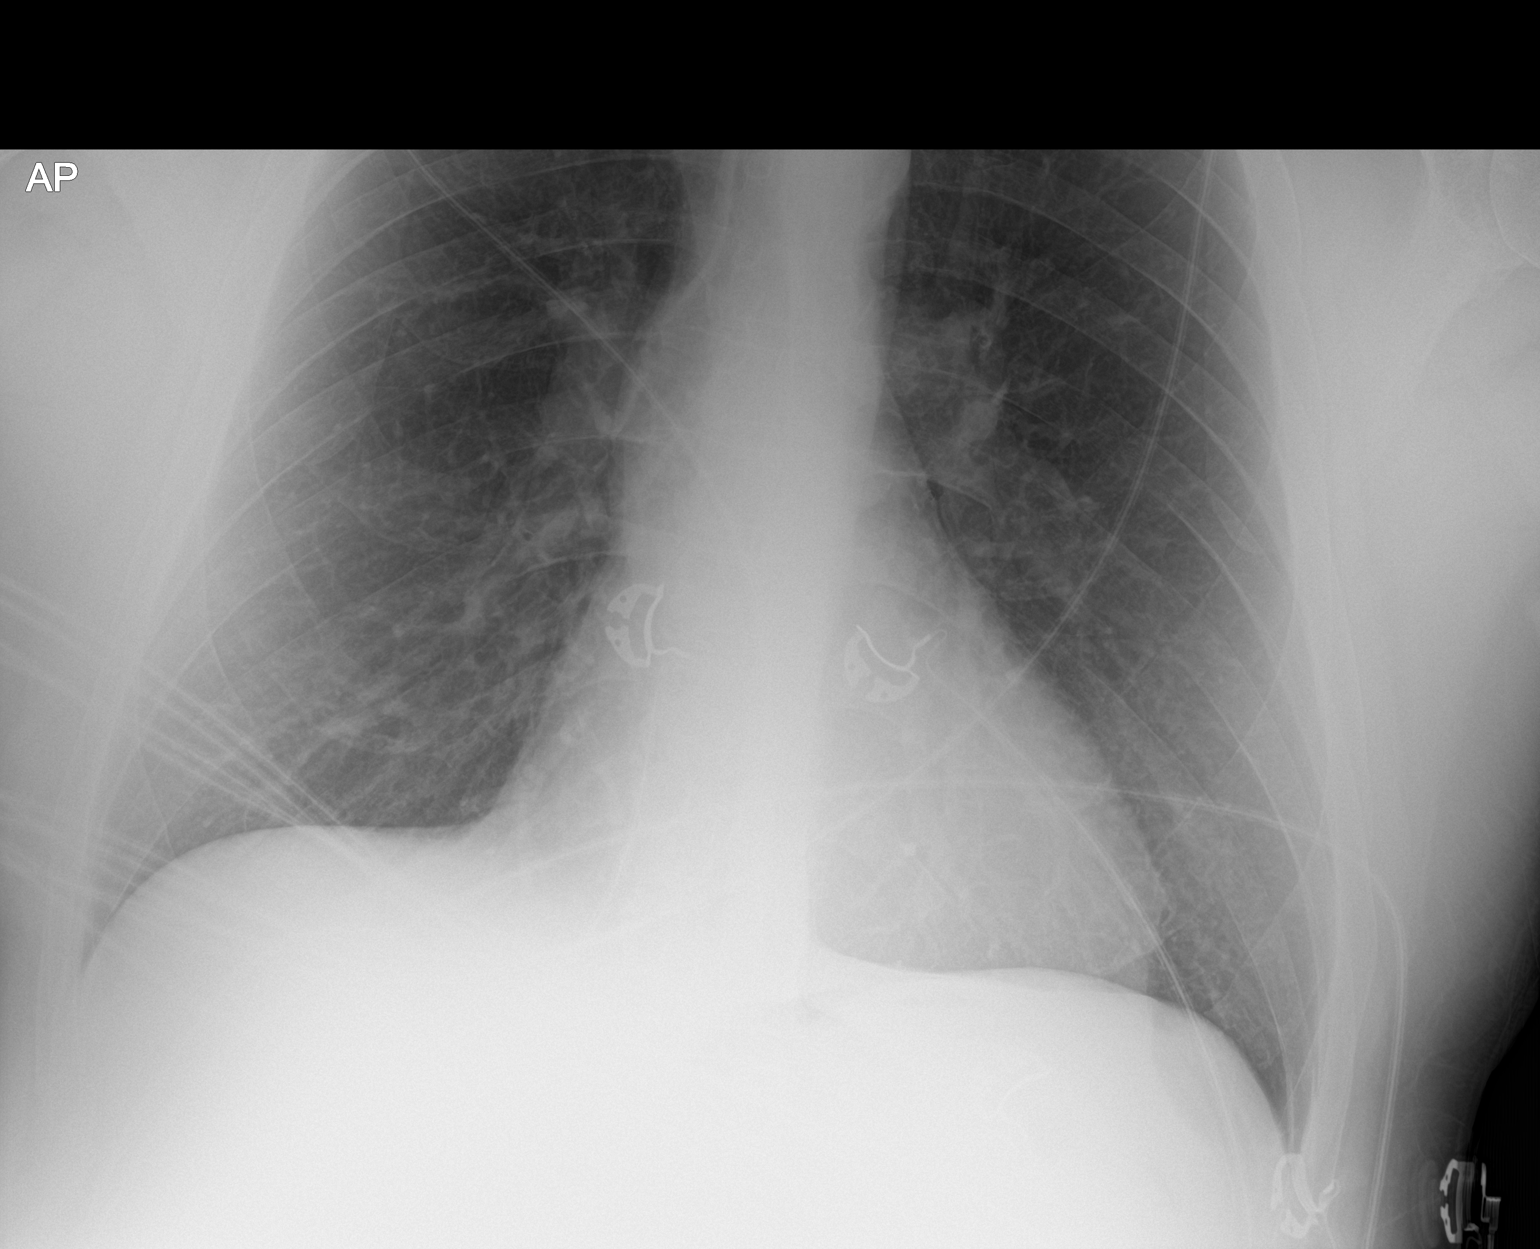

[2 of 2 positions shown; findings below may reference images not displayed]

FINDINGS: The heart size and mediastinal contours are within normal limits.
Subtle nodular opacity in the right midlung. Otherwise, clear lungs.
No visible pleural effusions or pneumothorax. Cardiomediastinal
silhouette is within normal limits.
IMPRESSION: Subtle nodular opacity in the right midlung. This could potentially
represent a confluence of shadows, but recommend CT chest
(preferably with contrast) to exclude malignancy.

## 2021-01-14 IMAGING — CT CT CHEST W/ CM
2 of 4 series · 15 of 36 positions shown, 18 images · IV contrast (omnipaque)
Comparison: Same day chest radiograph

CLINICAL DATA: Chest pressure, abnormal chest x-ray

EXAM:
CT CHEST WITH CONTRAST
TECHNIQUE: Multidetector CT imaging of the chest was performed during
intravenous contrast administration.
CONTRAST:  100mL OMNIPAQUE IOHEXOL 300 MG/ML  SOLN

[Series 2: axial st · axial · 0.80mm/px · z∈[-247,+39]mm · 12 of 171 slices shown, 15 images]
[im 14/171  mediastinal]
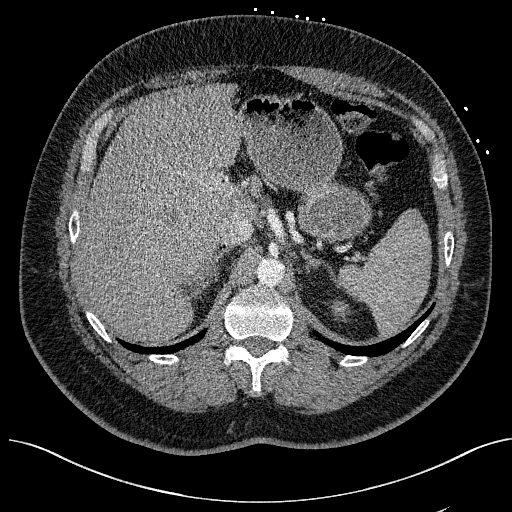
[im 14/171  lung]
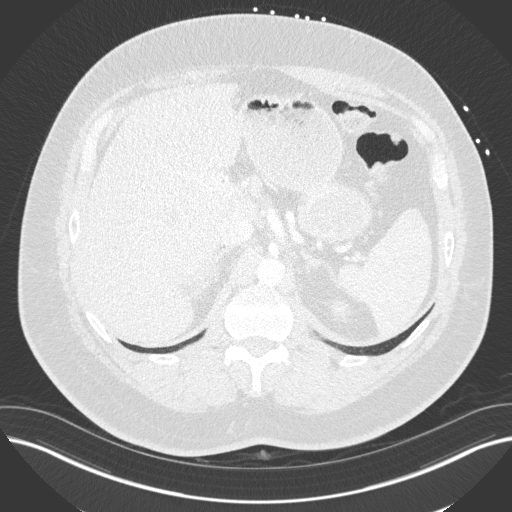
[im 27/171  lung]
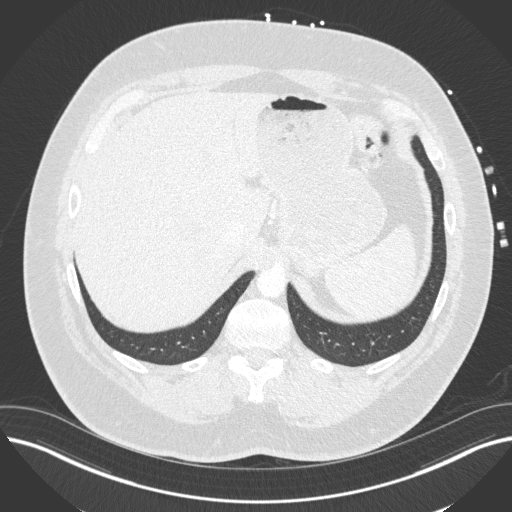
[im 40/171  lung]
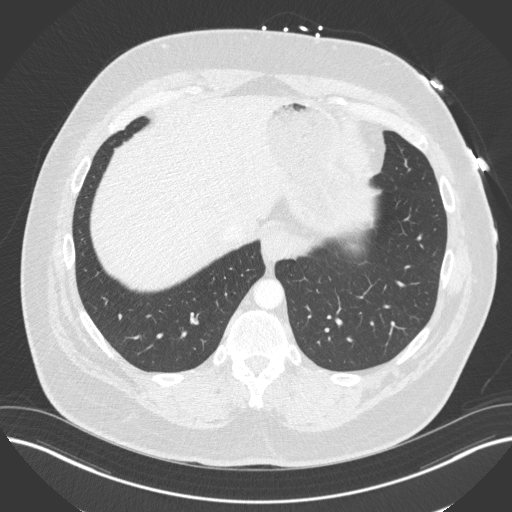
[im 53/171  lung]
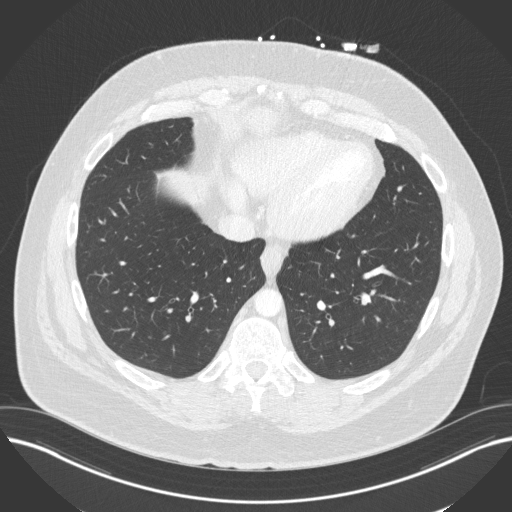
[im 66/171  mediastinal]
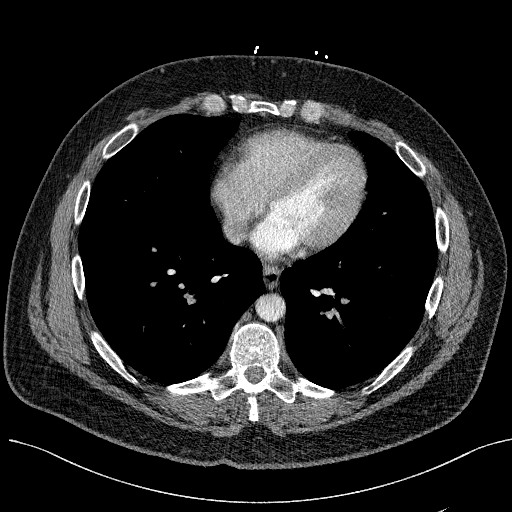
[im 66/171  lung]
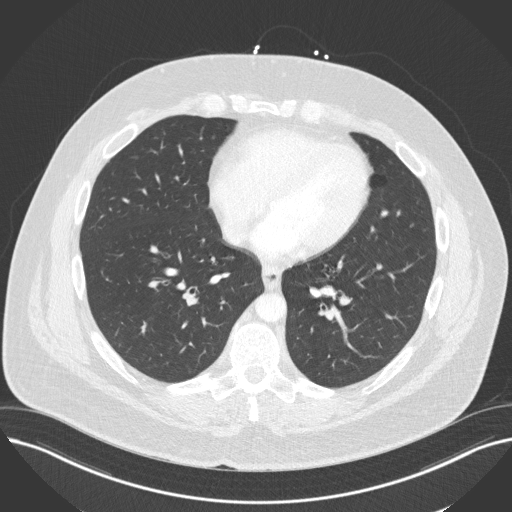
[im 79/171  lung]
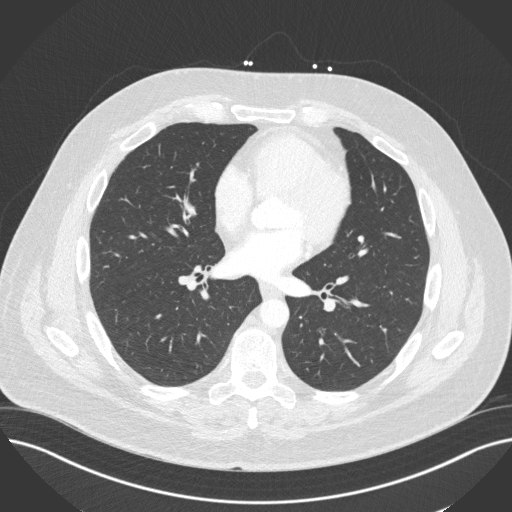
[im 92/171  lung]
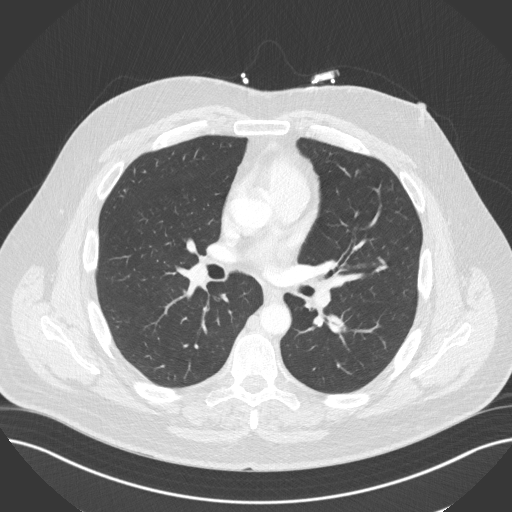
[im 105/171  lung]
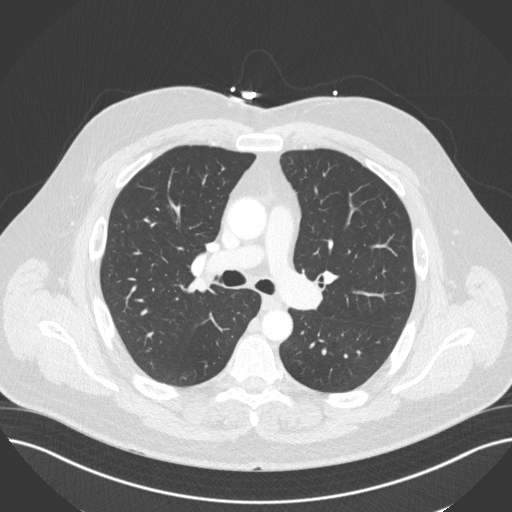
[im 118/171  mediastinal]
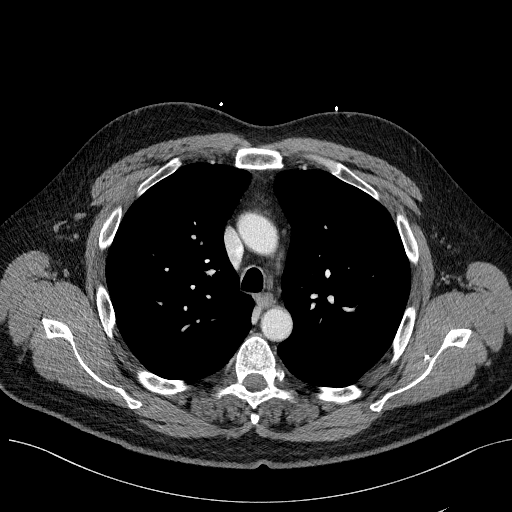
[im 118/171  lung]
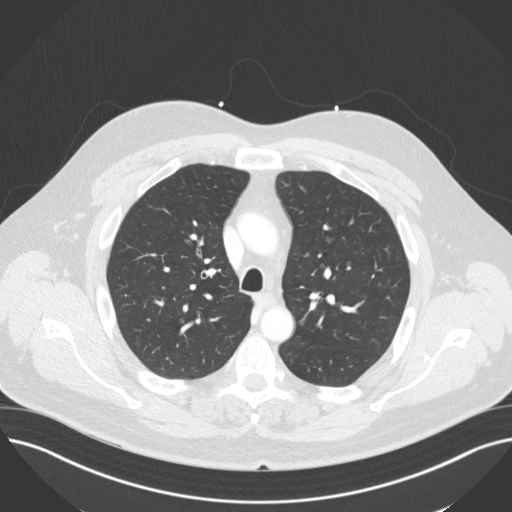
[im 131/171  lung]
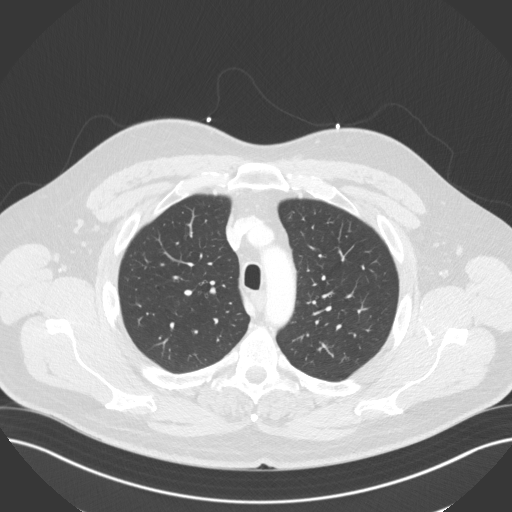
[im 144/171  lung]
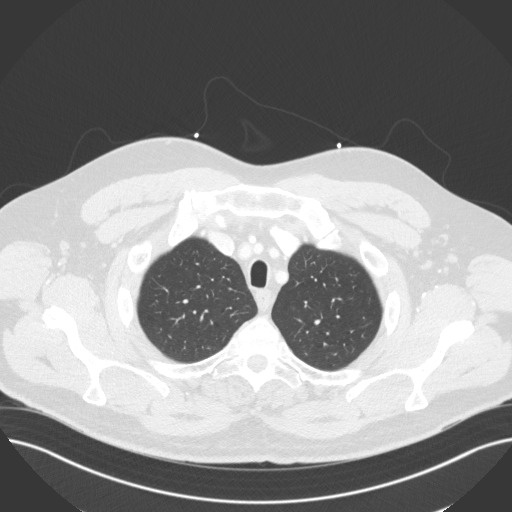
[im 157/171  lung]
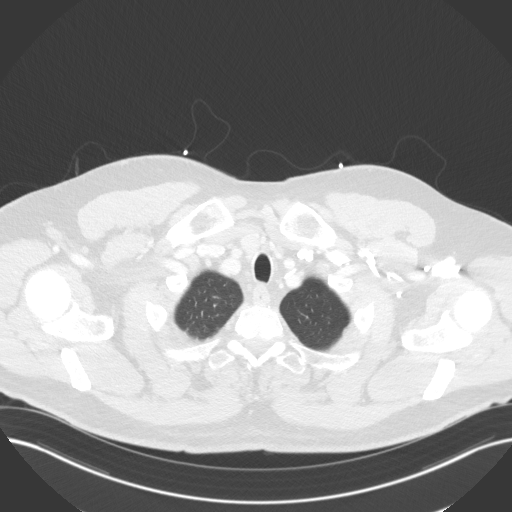

[Series 5: coronal · coronal · 0.66mm/px · 3 of 168 slices shown]
[im 34/168  lung]
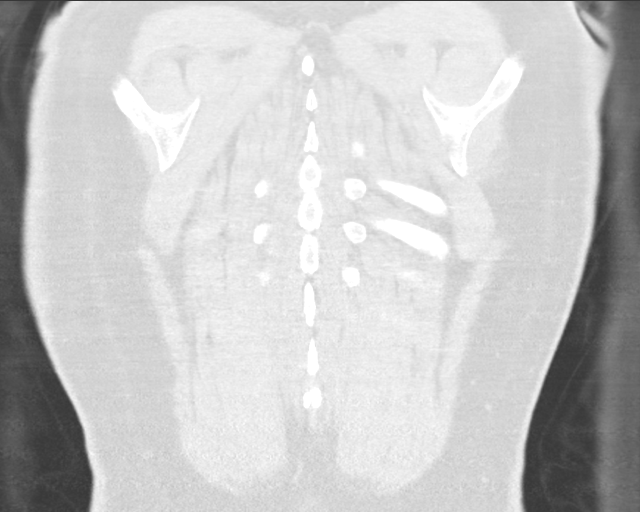
[im 67/168  lung]
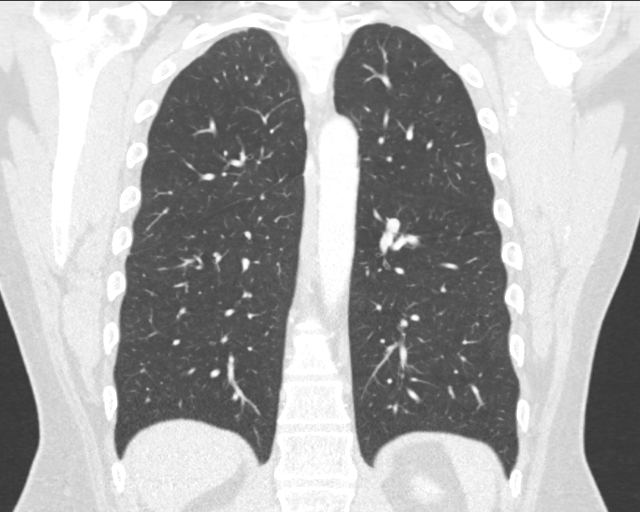
[im 101/168  lung]
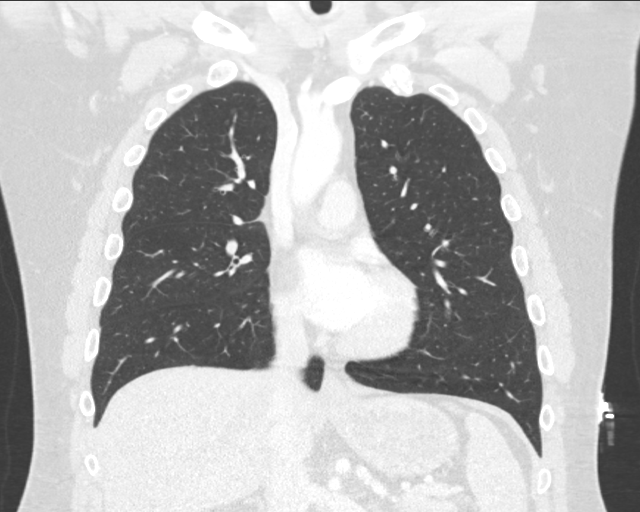

[15 of 36 positions shown; findings below may reference images not displayed]

FINDINGS: Cardiovascular: The heart is not enlarged. There is no pericardial
effusion. There is minimal calcified atherosclerotic plaque of the
aortic arch.

Mediastinum/Nodes: The imaged thyroid is unremarkable. The esophagus
is grossly unremarkable. There is no mediastinal, hilar, or axillary
lymphadenopathy.

Lungs/Pleura: The trachea and central airways are patent.

The lungs are clear, with no focal consolidation or pulmonary edema.
There is no pleural effusion or pneumothorax. Specifically, there is
no finding to correspond to the finding on the same day chest
radiograph. There is no suspicious nodule.

Upper Abdomen: The imaged portions of the upper abdominal viscera
are unremarkable.

Musculoskeletal: The bones are unremarkable.
IMPRESSION: Unremarkable chest CT. Specifically, no finding to correspond to the
findings on the same-day chest radiograph.

## 2021-01-14 MED ORDER — IOHEXOL 300 MG/ML  SOLN
100.0000 mL | Freq: Once | INTRAMUSCULAR | Status: AC | PRN
  Administered 2021-01-14: 100 mL via INTRAVENOUS

## 2021-01-14 NOTE — ED Notes (Signed)
SpO2 97-100% on r/a with ambulation.  HR 68-80. Mild DOE indorsed

## 2021-01-14 NOTE — ED Notes (Signed)
Pt ambulated on room air and maintained O2 sats in upper 90s.

## 2021-01-14 NOTE — ED Notes (Signed)
ED Provider at bedside. 

## 2021-01-14 NOTE — ED Triage Notes (Signed)
Pt arrives ambulatory to ED with c/o CP X2 weeks with new pressure today, states he feels like something is sitting on top of him with some SOB X3 days with headache X2 days. CP is left sided with radiation into left shoulder and back.

## 2021-01-14 NOTE — ED Provider Notes (Signed)
[facility] Provider Note   CSN: [id number] Arrival date & time: 01/14/21  0745     History Chief Complaint  Patient presents with   Chest Pain    Richard Ewing is a 50 y.o. male with PMH of an unknown polyneuropathy syndrome who presents to the emergency department with a myriad of complaints including chest pain, chest pressure, rash, shortness of breath and headache.  Patient states that over the last 2 weeks he has had intermittent chest pains but had new pressure-like chest pain today.  The pressure is not associated with exertion, nausea, vomiting, diaphoresis.  He states that he recently moved from [place] and does not have any primary care follow-up or neurologic follow-up.  He states that he will intermittently get a reticular lacy rash over the lower extremities that is not present on exam today.  He states that he has had shortness of breath with exertion but denies lower extremity swelling, palpitations.  Denies fever, abdominal pain, nausea, vomiting or other systemic symptoms.   Chest Pain Associated symptoms: headache and shortness of breath   Associated symptoms: no abdominal pain, no back pain, no cough, no fever, no palpitations and no vomiting       History reviewed. No pertinent past medical history.  There are no problems to display for this patient.   History reviewed. No pertinent surgical history.     No family history on file.  Social History   Tobacco Use   Smoking status: Every Day    Packs/day: 0.50    Types: Cigarettes   Smokeless tobacco: Never  Vaping Use   Vaping Use: Never used  Substance Use Topics   Alcohol use: Yes    Comment: 3-4 drinks per week   Drug use: Never    Home Medications Prior to Admission medications   Not on File    Allergies    Erythromycin and Shellfish allergy  Review of Systems   Review of Systems  Constitutional:  Negative for chills and fever.  HENT:  Negative for ear  pain and sore throat.   Eyes:  Negative for pain and visual disturbance.  Respiratory:  Positive for shortness of breath. Negative for cough.   Cardiovascular:  Positive for chest pain. Negative for palpitations.  Gastrointestinal:  Negative for abdominal pain and vomiting.  Genitourinary:  Negative for dysuria and hematuria.  Musculoskeletal:  Negative for arthralgias and back pain.  Skin:  Positive for rash. Negative for color change.  Neurological:  Positive for headaches. Negative for seizures and syncope.  All other systems reviewed and are negative.  Physical Exam Updated Vital Signs BP (!) 139/95   Pulse 75   Temp 99 F (37.2 C) (Oral)   Resp 15   Ht 6' 6"  (1.981 m)   Wt (!) 138.8 kg   SpO2 98%   BMI 35.36 kg/m   Physical Exam Vitals and nursing note reviewed.  Constitutional:      Appearance: He is well-developed.  HENT:     Head: Normocephalic and atraumatic.  Eyes:     Conjunctiva/sclera: Conjunctivae normal.  Cardiovascular:     Rate and Rhythm: Normal rate and regular rhythm.     Heart sounds: No murmur heard. Pulmonary:     Effort: Pulmonary effort is normal. No respiratory distress.     Breath sounds: Normal breath sounds.  Abdominal:     Palpations: Abdomen is soft.     Tenderness: There is no abdominal tenderness.  Musculoskeletal:  Cervical back: Neck supple.  Skin:    General: Skin is warm and dry.  Neurological:     Mental Status: He is alert.    ED Results / Procedures / Treatments   Labs (all labs ordered are listed, but only abnormal results are displayed) Labs Reviewed  COMPREHENSIVE METABOLIC PANEL - Abnormal; Notable for the following components:      Result Value   Potassium 3.4 (*)    Glucose, Bld 130 (*)    Calcium 8.6 (*)    Total Bilirubin 1.3 (*)    Anion gap 4 (*)    All other components within normal limits  URINALYSIS, ROUTINE W REFLEX MICROSCOPIC - Abnormal; Notable for the following components:   APPearance TURBID  (*)    All other components within normal limits  URINALYSIS, MICROSCOPIC (REFLEX) - Abnormal; Notable for the following components:   Bacteria, UA FEW (*)    All other components within normal limits  RESP PANEL BY RT-PCR (FLU A&B, COVID) ARPGX2  CBC WITH DIFFERENTIAL/PLATELET  BRAIN NATRIURETIC PEPTIDE  D-DIMER, QUANTITATIVE  TROPONIN I (HIGH SENSITIVITY)  TROPONIN I (HIGH SENSITIVITY)    EKG None  Radiology CT Chest W Contrast  Result Date: 01/14/2021 CLINICAL DATA:  Chest pressure, abnormal chest x-ray EXAM: CT CHEST WITH CONTRAST TECHNIQUE: Multidetector CT imaging of the chest was performed during intravenous contrast administration. CONTRAST:  164m OMNIPAQUE IOHEXOL 300 MG/ML  SOLN COMPARISON:  Same day chest radiograph FINDINGS: Cardiovascular: The heart is not enlarged. There is no pericardial effusion. There is minimal calcified atherosclerotic plaque of the aortic arch. Mediastinum/Nodes: The imaged thyroid is unremarkable. The esophagus is grossly unremarkable. There is no mediastinal, hilar, or axillary lymphadenopathy. Lungs/Pleura: The trachea and central airways are patent. The lungs are clear, with no focal consolidation or pulmonary edema. There is no pleural effusion or pneumothorax. Specifically, there is no finding to correspond to the finding on the same day chest radiograph. There is no suspicious nodule. Upper Abdomen: The imaged portions of the upper abdominal viscera are unremarkable. Musculoskeletal: The bones are unremarkable. IMPRESSION: Unremarkable chest CT. Specifically, no finding to correspond to the findings on the same-day chest radiograph. Electronically Signed   By: PValetta MoleM.D.   On: 01/14/2021 10:05   DG Chest Portable 1 View  Result Date: 01/14/2021 CLINICAL DATA:  chest pain EXAM: PORTABLE CHEST 1 VIEW COMPARISON:  None. FINDINGS: The heart size and mediastinal contours are within normal limits. Subtle nodular opacity in the right midlung.  Otherwise, clear lungs. No visible pleural effusions or pneumothorax. Cardiomediastinal silhouette is within normal limits. IMPRESSION: Subtle nodular opacity in the right midlung. This could potentially represent a confluence of shadows, but recommend CT chest (preferably with contrast) to exclude malignancy. Electronically Signed   By: FMargaretha SheffieldM.D.   On: 01/14/2021 08:40    Procedures Procedures   Medications Ordered in ED Medications  iohexol (OMNIPAQUE) 300 MG/ML solution 100 mL (100 mLs Intravenous Contrast Given 01/14/21 0948)    ED Course  I have reviewed the triage vital signs and the nursing notes.  Pertinent labs & imaging results that were available during my care of the patient were reviewed by me and considered in my medical decision making (see chart for details).    MDM Rules/Calculators/A&P                           Patient seen the emergency department for evaluation of multiple complaints as  described above.  Physical exam is unremarkable.  ECG is nonischemic.  Laboratory evaluation reveals mild hypokalemia but is otherwise unremarkable.  D-dimer is negative, high-sensitivity troponin and delta troponin unremarkable.  COVID and flu negative.  Chest x-ray reveals a questionable spot in the right middle lung concerning for possible EVT but follow-up CT chest with contrast was negative for any evidence of acute pathology in the chest.  While in the emergency department today, the patient had no chest pain or chest pressure and this had resolved prior to arrival.  We performed a walk of life here in the emergency department and the patient maintained his oxygen saturations with exertion and showed no evidence of significant dyspnea.  I provided resources for neurologic follow-up as well as establishment of a primary care physician.  Patient with a heart score of 3 and is safe for discharge at this time.  I do have concern that the patient may have an anxiety component to  his symptoms today and encouraged him to talk with his new primary care physician about this.  Patient then discharged. Final Clinical Impression(s) / ED Diagnoses Final diagnoses:  None    Rx / DC Orders ED Discharge Orders     None        Eiza Canniff, MD 01/14/21 1525

## 2022-03-23 IMAGING — MR MRI THORACIC SPINE WITHOUT CONTRAST
3 of 6 series · 7 of 48 positions shown · non-contrast
Comparison: None.

MRI THORACIC SPINE WITHOUT CONTRAST , 03/24/2022 [DATE]: 
CLINICAL INDICATION: Abnormal gait due to impairment of balance.
TECHNIQUE: Multiplanar, multiecho position MR images of the thoracic spine were 
performed without contrast.

[Series 16: T2 · sagittal · 4.0mm · 0.70mm/px · 3 of 17 slices shown (1 of 3)]
[im 1/17]
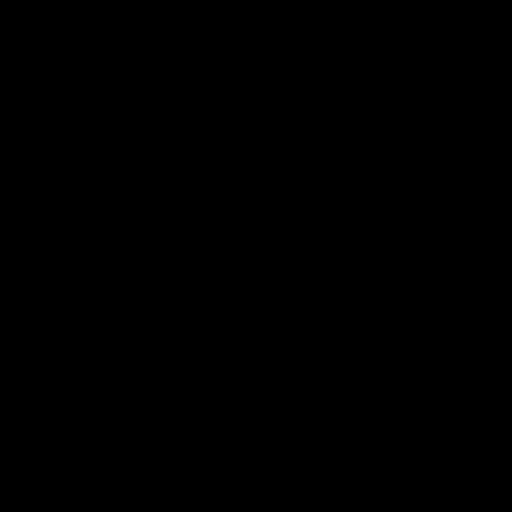
[im 11/17]
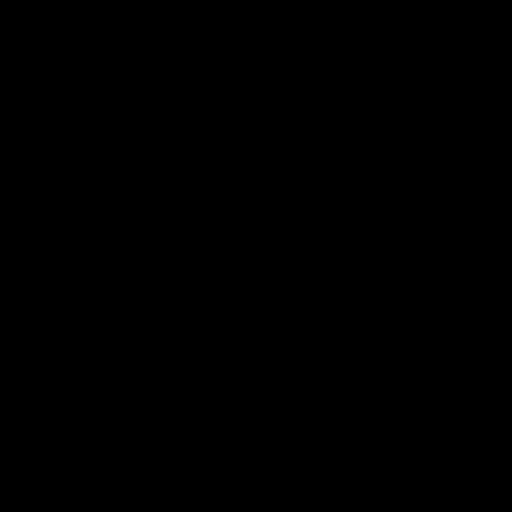
[im 17/17]
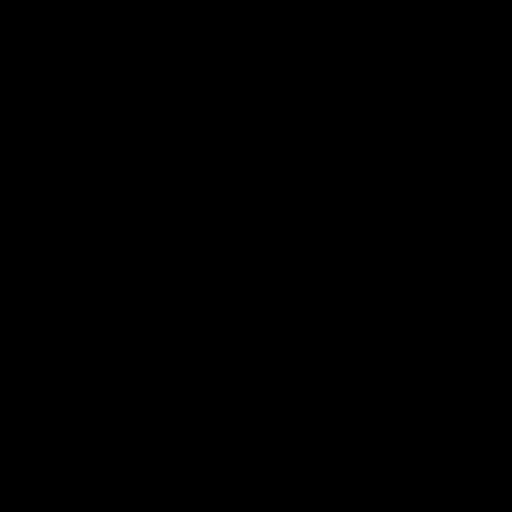

[Series 19: T2 · axial · 4.0mm · 0.21mm/px · z∈[-489,-243]mm · 3 of 80 slices shown (2 of 3)]
[im 12/80]
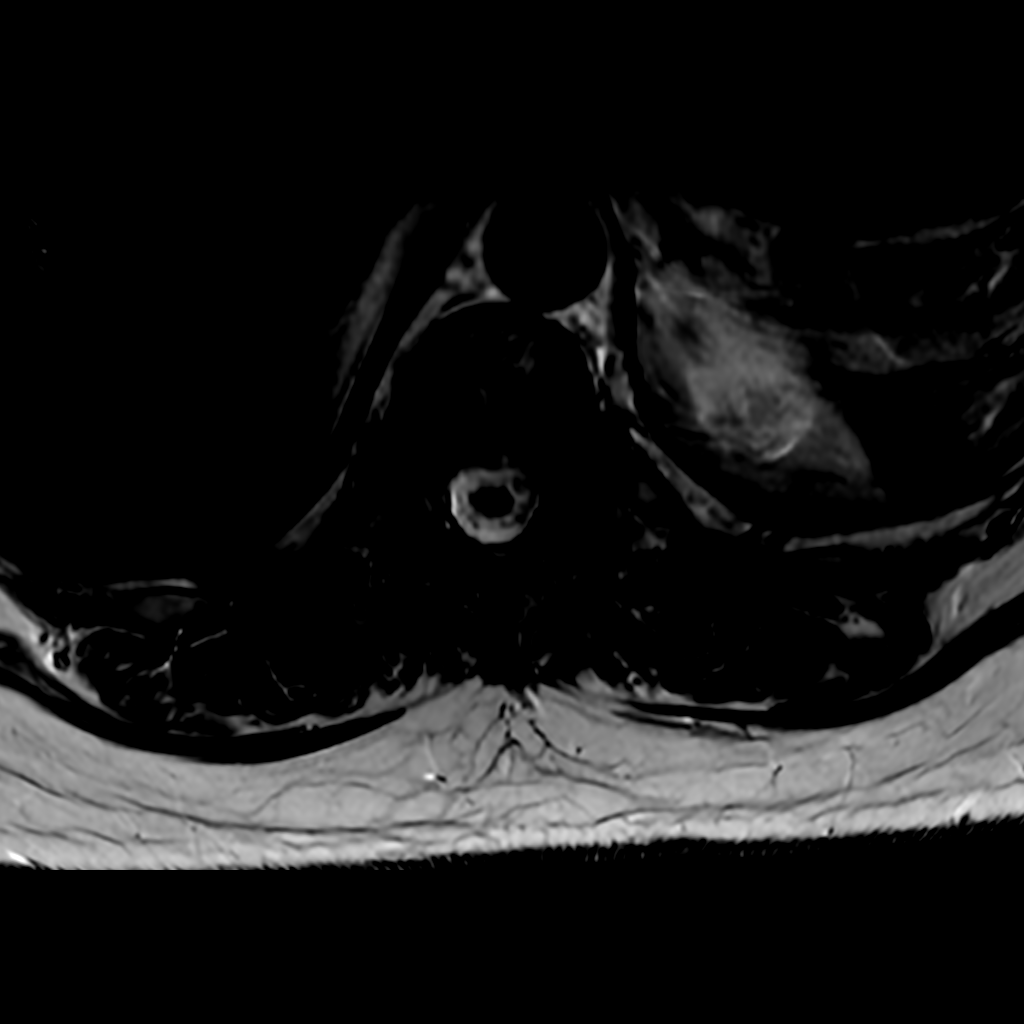
[im 40/80]
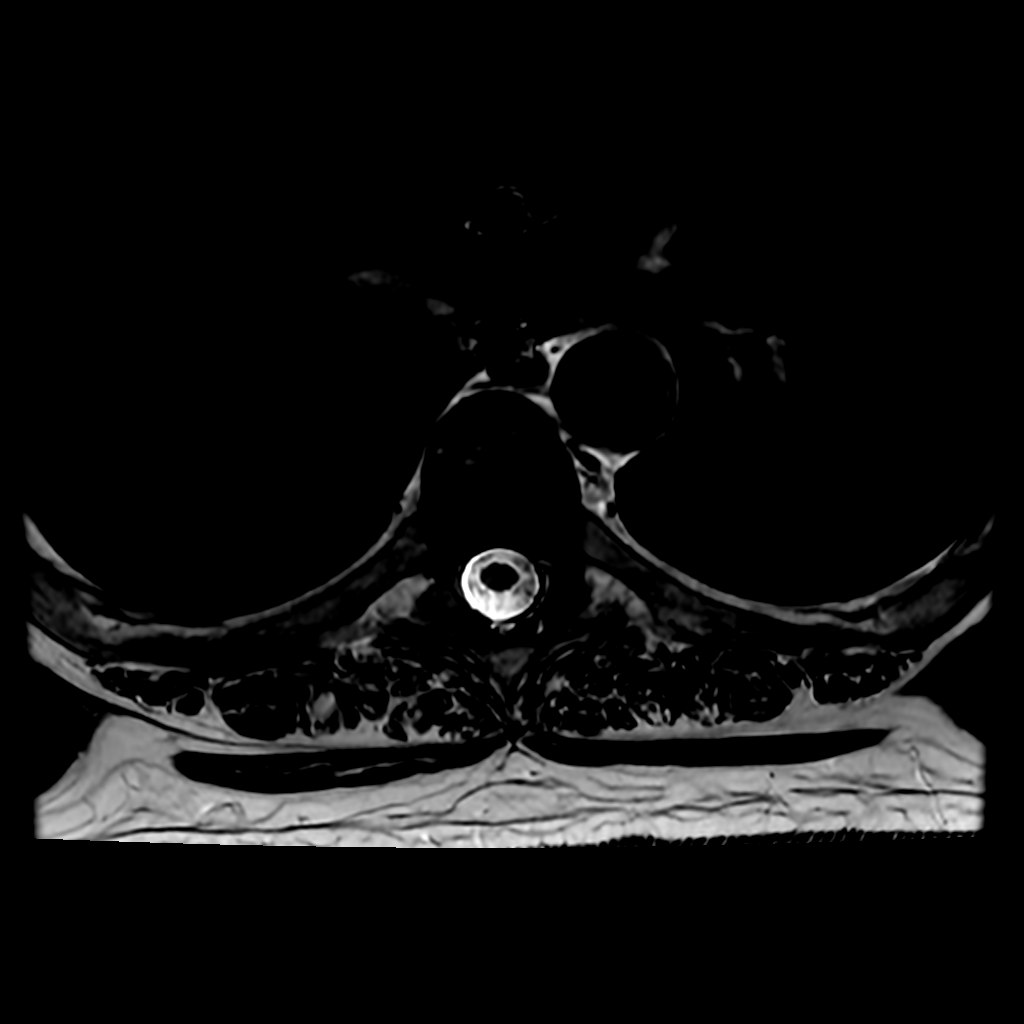
[im 68/80]
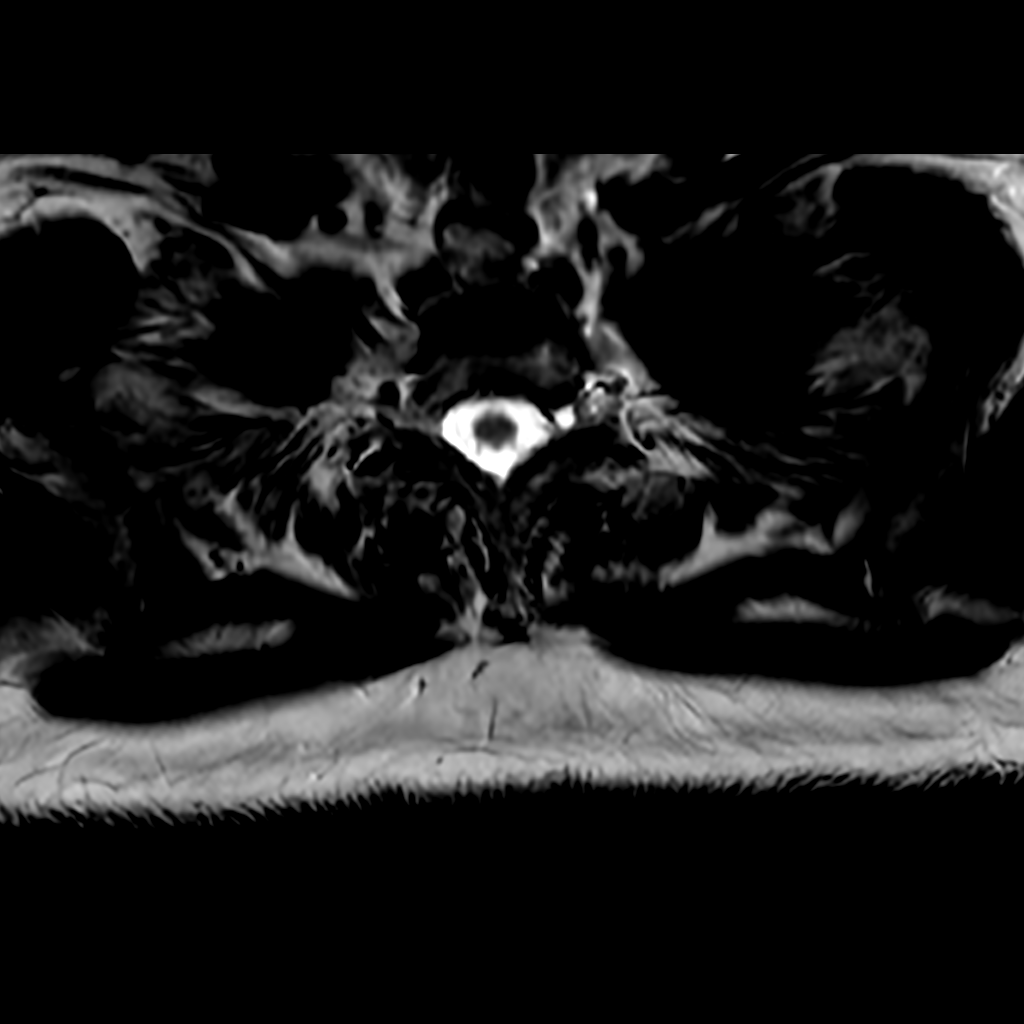

[Series 21: T2 · axial · 4.0mm · 0.16mm/px · 1 of 39 slices shown (3 of 3)]
[im 7/39]
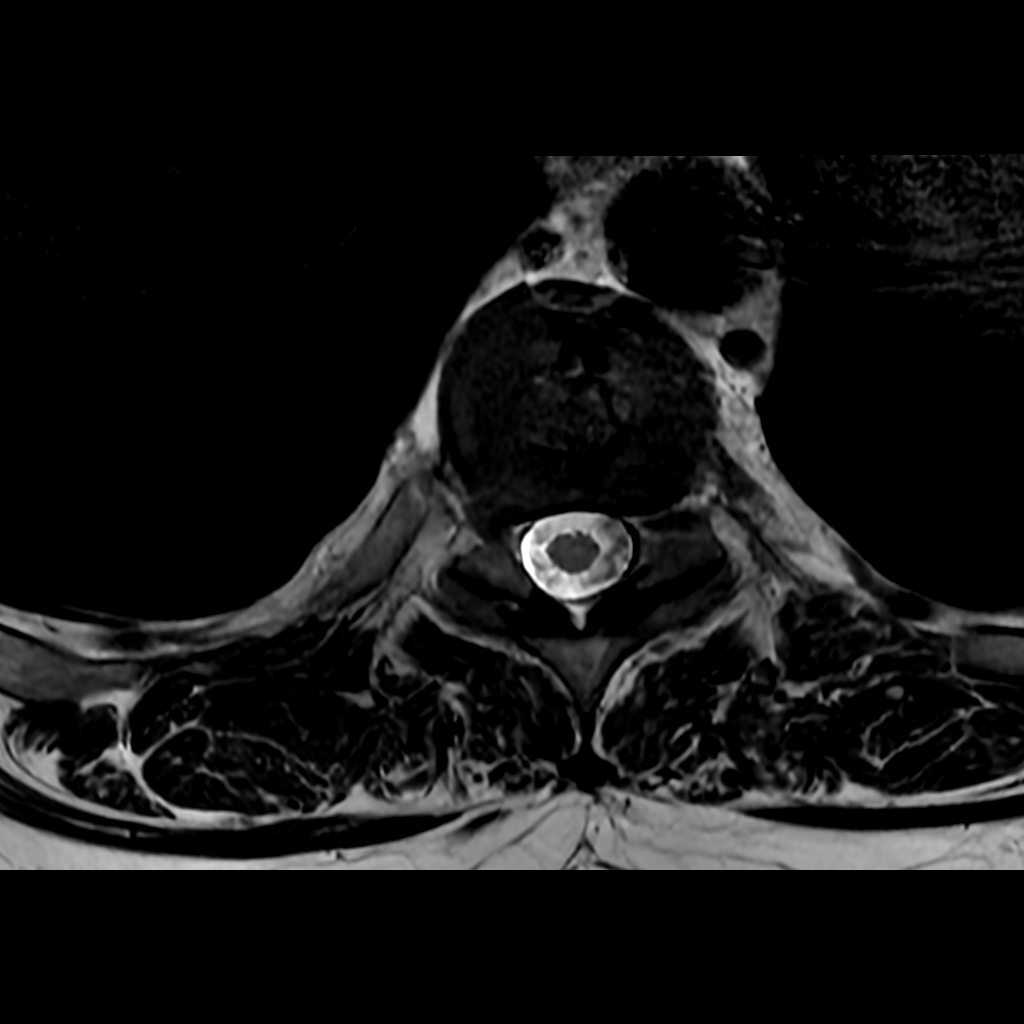

[7 of 48 positions shown; findings below may reference images not displayed]

FINDINGS: VERTEBRA: No fractures or marrow replacing lesions. T4 vertebral body 
hemangioma. Multilevel marginal osteophytes. T3-4 type II Modic changes. No 
central canal or neural foraminal stenosis.  
DISCS: Multilevel degenerative disc disease. Small T4-5, T5-6 and T8-9 disc 
protrusions with flattening of the ventral cord at the level of the T4-5 disc.  
SPINAL CORD: Mild flattening of the ventral cord at the level of the T4-5 disc 
protrusion. Spinal cord is otherwise normal in caliber and signal intensity. 
Conus medullaris is at the level of L1.  
OTHER: Localizer views demonstrate degenerative change of the cervical and 
lumbar spine.
IMPRESSION: 1.  Mild degenerative change. 
2.  Small T4-5, T5-6 and T8-9 disc protrusions with flattening of the ventral 
cord at the level of the T4-5 disc.

## 2022-03-23 IMAGING — MR MRI HEAD WITHOUT CONTRAST
4 of 8 series · 16 of 48 positions shown · IV contrast (gadolinium)
Comparison: MRI cervical spine March 24, 2022.

MRI HEAD WITHOUT CONTRAST , 03/24/2022 [DATE]: 
CLINICAL INDICATION: Rule out any neurodegenerative disorders or myelopathic
TECHNIQUE: Multiplanar, multiecho position MR images of the brain were performed 
without intravenous gadolinium enhancement.

[Series 3: FLAIR · axial · 5.0mm · 0.43mm/px · z∈[-64,+109]mm · 6 of 35 slices shown]
[im 1/35]
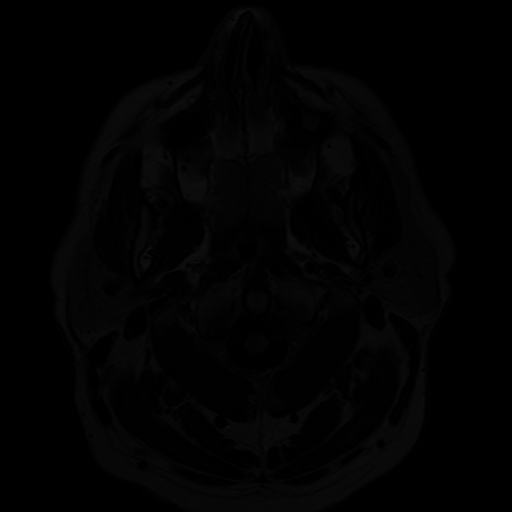
[im 7/35]
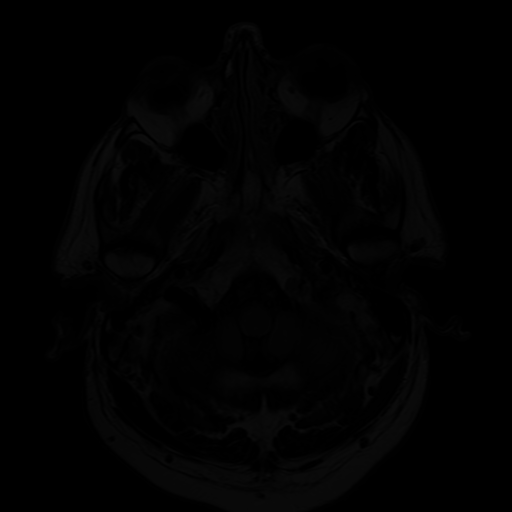
[im 14/35]
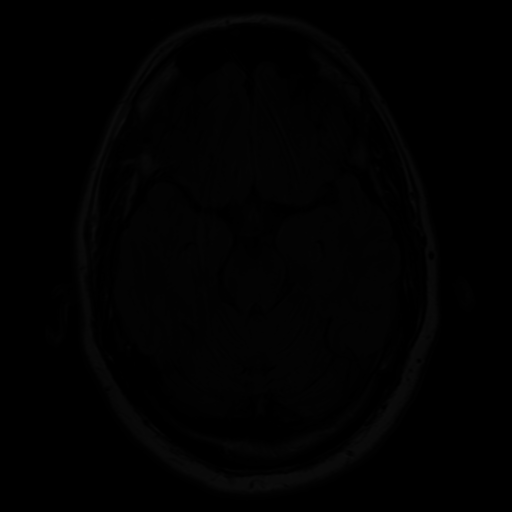
[im 21/35]
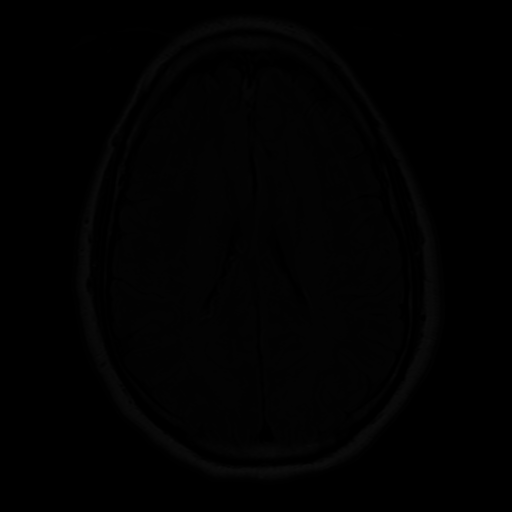
[im 28/35]
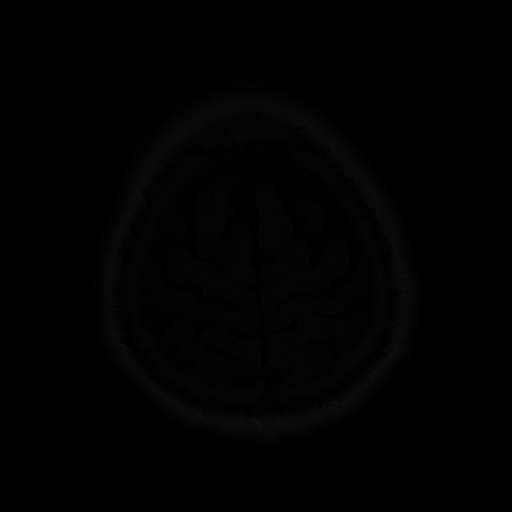
[im 35/35]
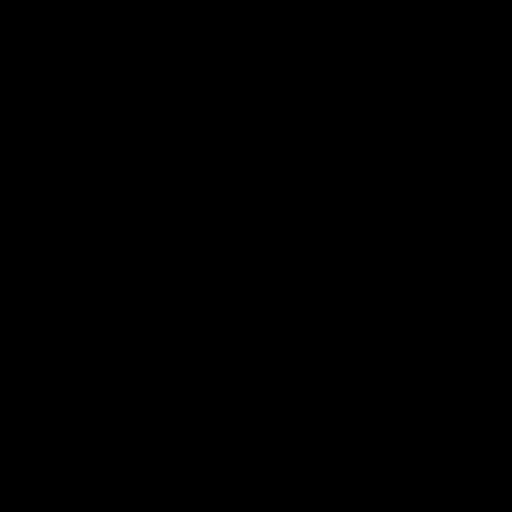

[Series 4: T2 · axial · 5.0mm · 0.21mm/px · z∈[-64,+109]mm · 3 of 35 slices shown]
[im 1/35]
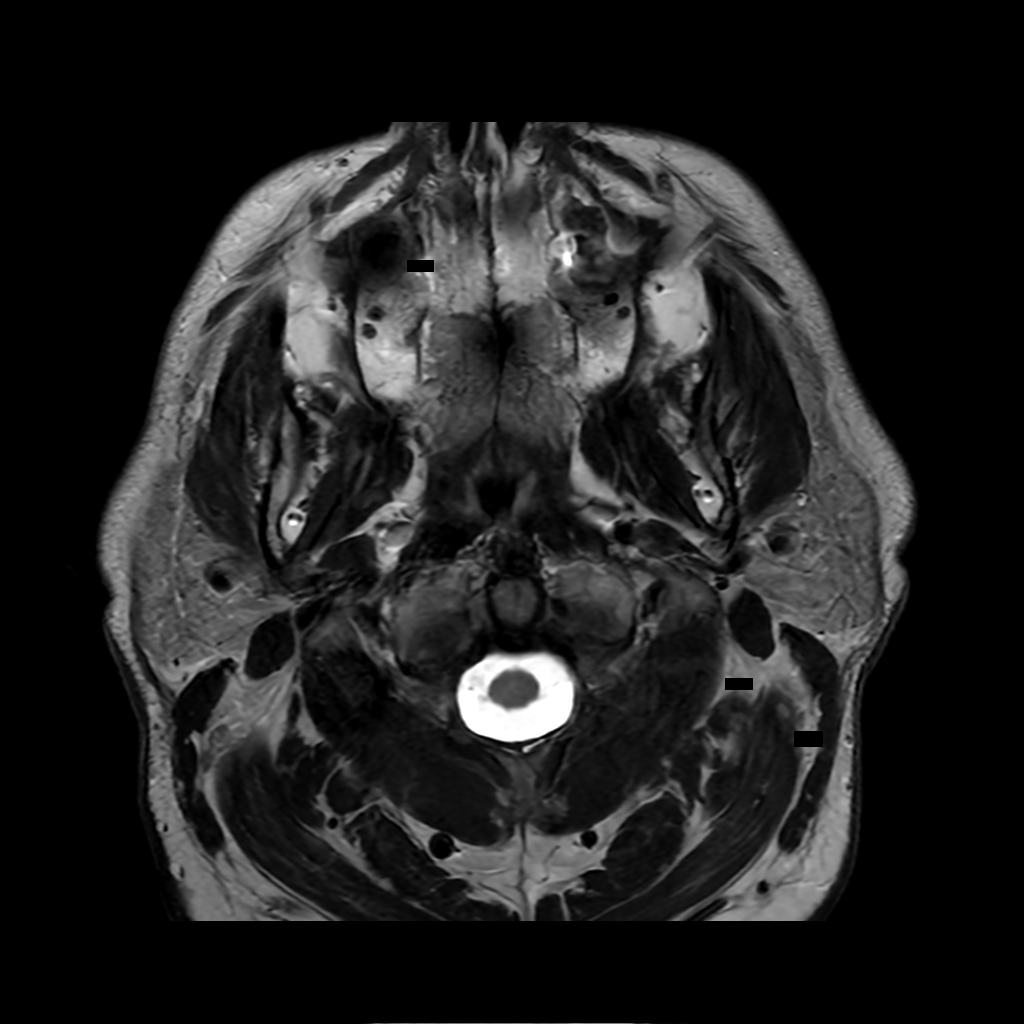
[im 18/35]
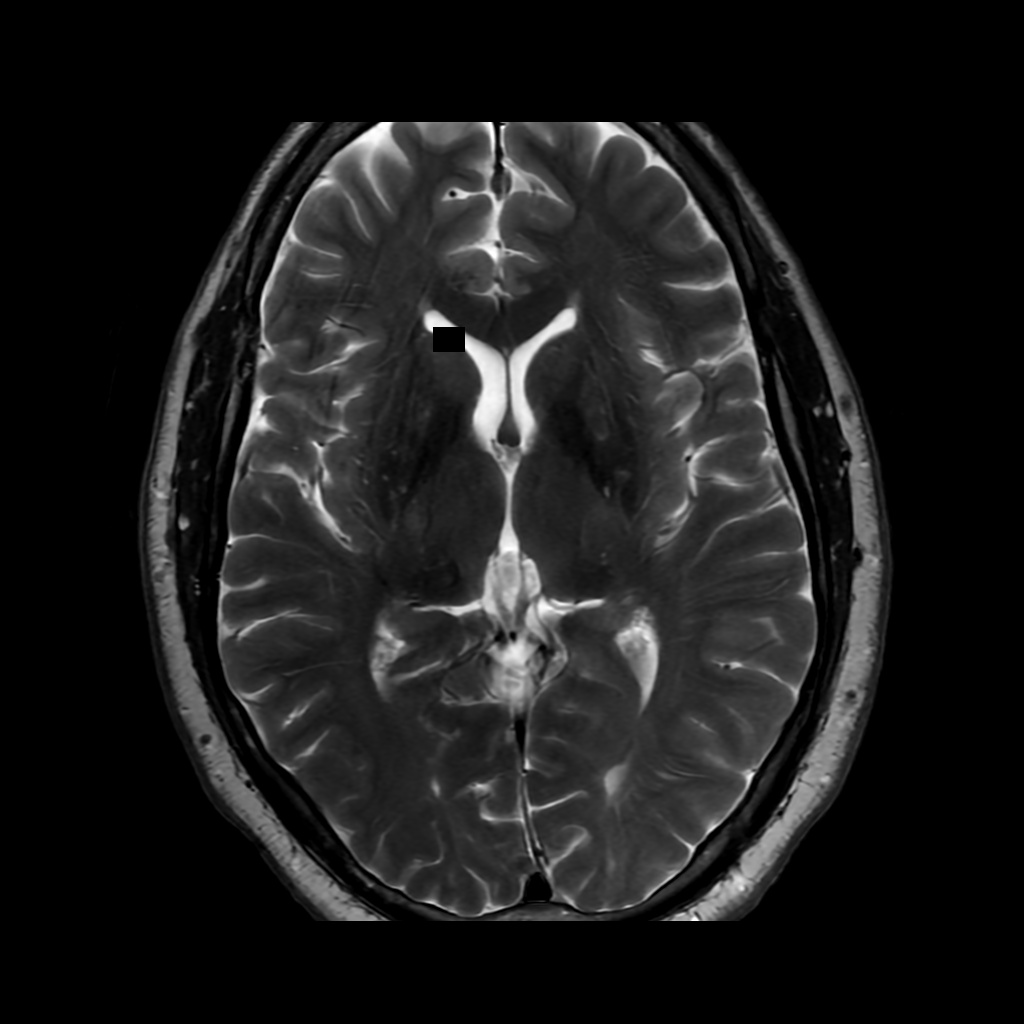
[im 35/35]
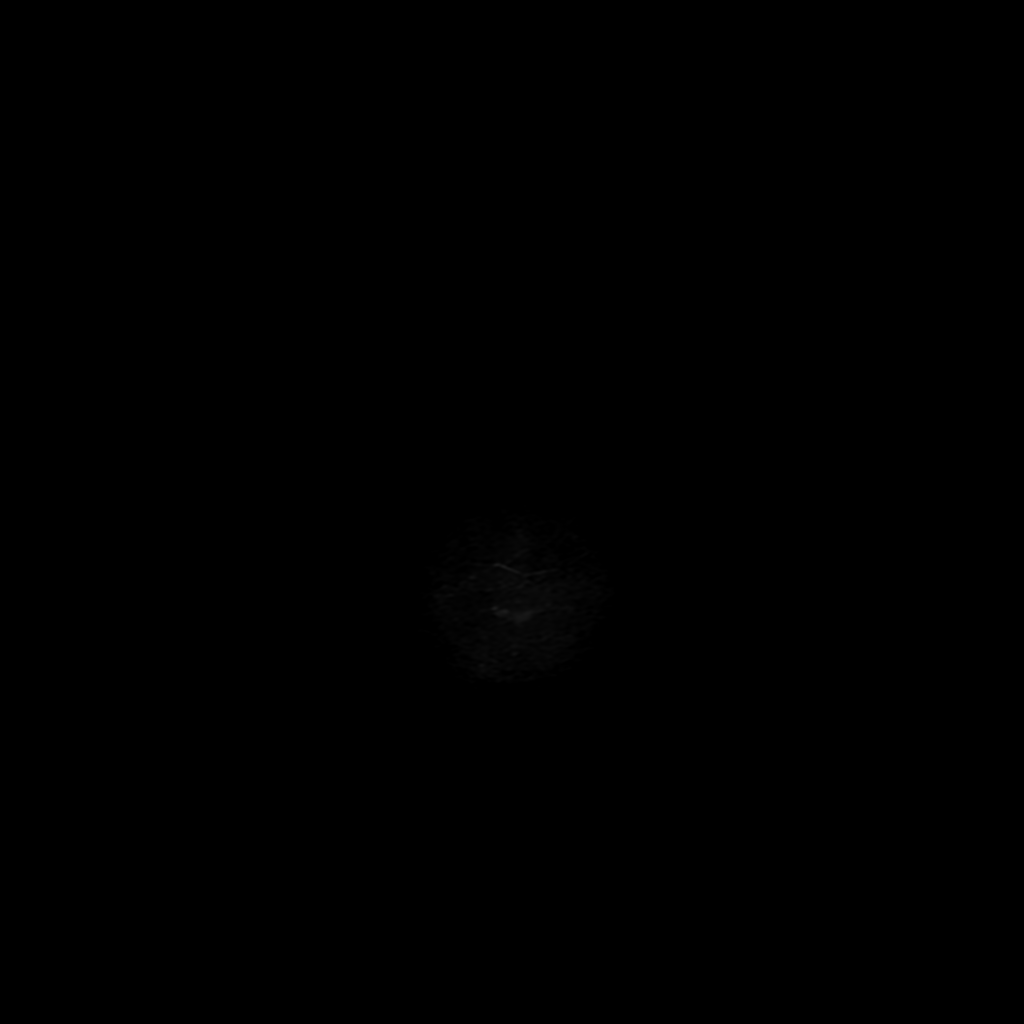

[Series 6: DWI · axial · 5.0mm · 0.94mm/px · z∈[-60,+98]mm · 4 of 70 slices shown]
[im 1/70]
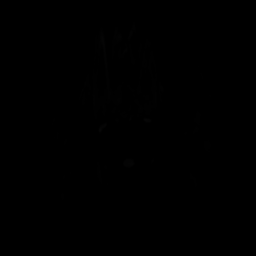
[im 7/70]
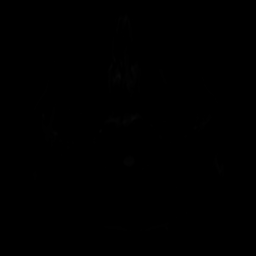
[im 35/70]
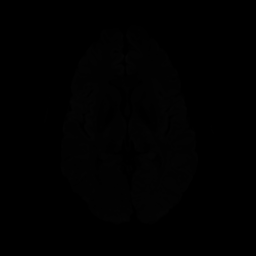
[im 63/70]
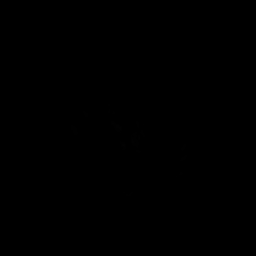

[Series 650: ADC · axial · 5.0mm · 0.94mm/px · z∈[-60,+113]mm · 3 of 35 slices shown]
[im 1/35]
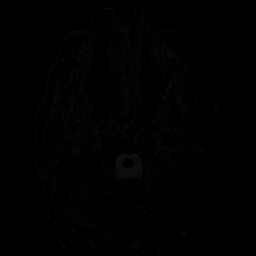
[im 18/35]
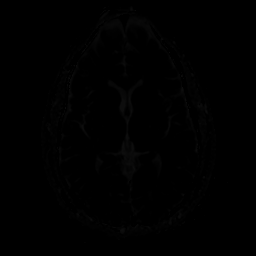
[im 35/35]
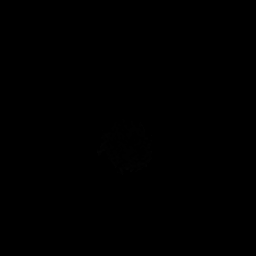

[16 of 48 positions shown; findings below may reference images not displayed]

FINDINGS: -------------------------------------------------------------------------------- 
------------------------- 
INTRACRANIAL: 
Mineralization of the falx cerebri. Minimal nonspecific periventricular T2 FLAIR 
hyperintensity. Otherwise there is normal brain parenchymal signal. No mass or 
abnormal extra-axial fluid collection. 
No acute ischemia. No abnormal foci of susceptibility artifact in the brain. 
Patency of the intracranial vascular flow voids.  No acute intracranial 
hemorrhage, mass effect, midline shift. No large sellar mass. No hydrocephalus. 
Cerebral volume is age appropriate.  
-------------------------------------------------------------------------------- 
----------------------- 
OTHER: 
ORBITS/SINUSES/T-BONES:  Visualized orbits show no acute abnormality or mass.  
Mastoid air cells and middle ear cavities are grossly clear.  Left maxillary 
mucous retention cysts. 
MARROW SIGNAL/SOFT TISSUES: No focal suspect signal abnormality.  
-------------------------------------------------------------------------------- 
-------------------
IMPRESSION: No acute intracranial process. No mass or mass effect. Brain parenchymal signal 
is unremarkable for age. 
Left maxillary mucous retention cysts.

## 2022-03-23 IMAGING — MR MRI CERVICAL SPINE WITHOUT CONTRAST
2 of 4 series · 5 of 48 positions shown · IV contrast (gadolinium)
Comparison: Brain MRI and MR thoracic spine March 24, 2022.

MRI CERVICAL SPINE WITHOUT CONTRAST , 03/24/2022 [DATE]: 
CLINICAL INDICATION: Rule out any neurodegenerative disorders or myelopathic
TECHNIQUE: Multiplanar, multiecho position MR images of the cervical spine were 
performed without intravenous gadolinium enhancement.

[Series 11: STIR · sagittal · 3.0mm · 0.23mm/px · 2 of 18 slices shown]
[im 1/18]
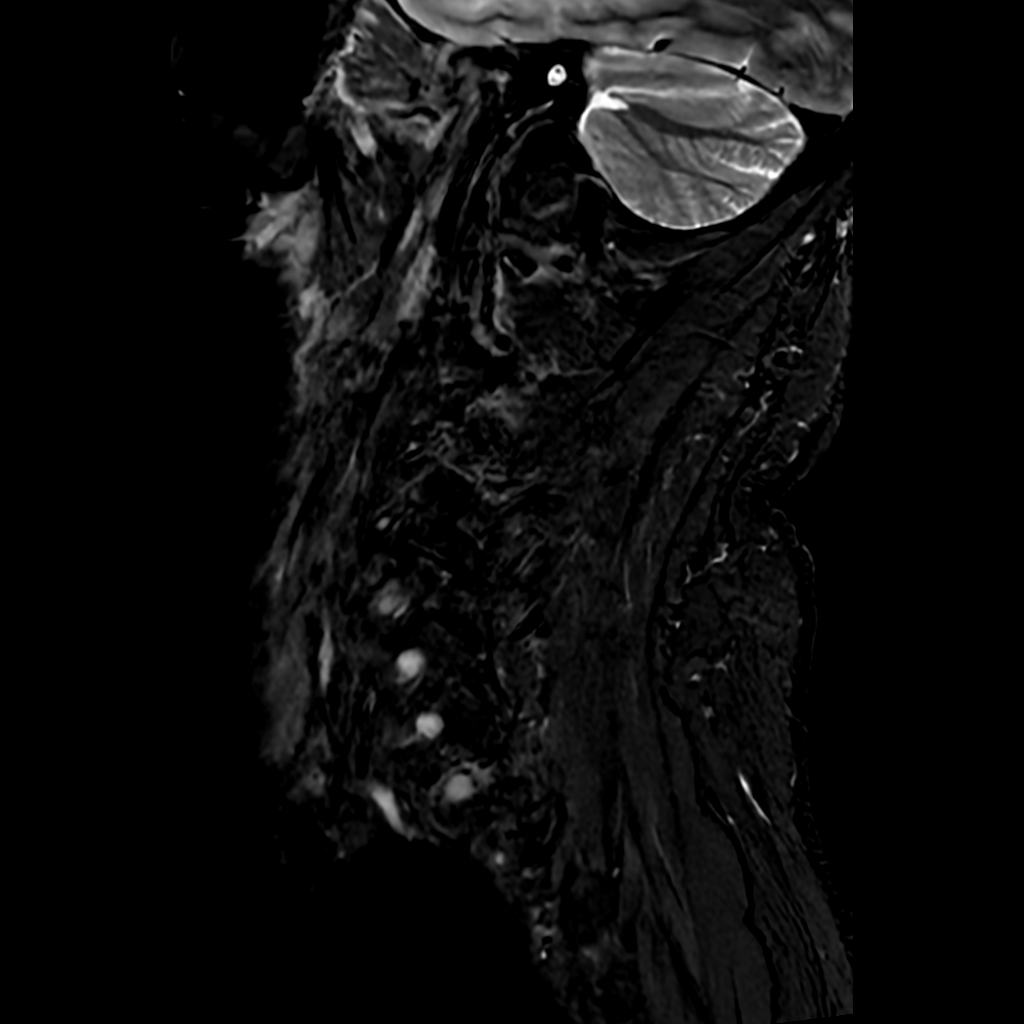
[im 12/18]
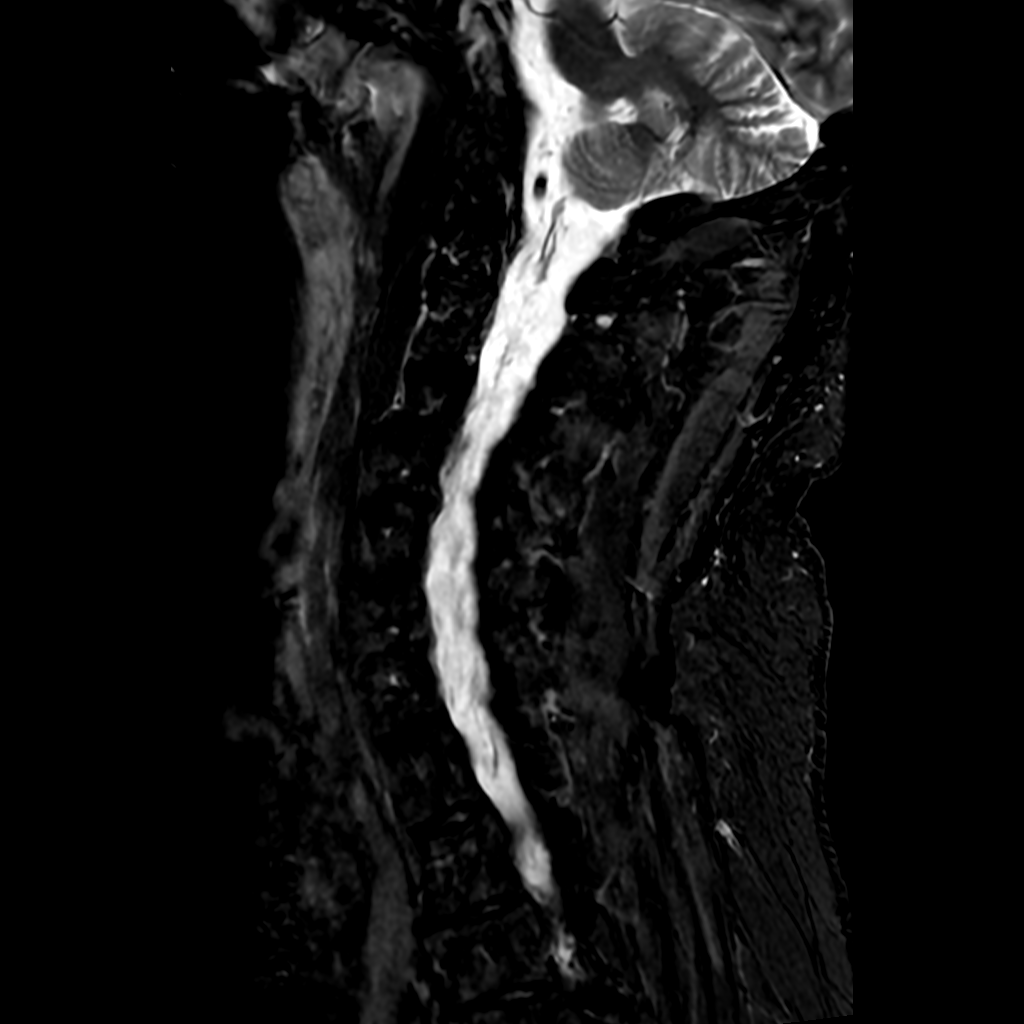

[Series 14: T2 · axial · 3.0mm · 0.14mm/px · z∈[-255,-151]mm · 3 of 18 slices shown]
[im 1/18]
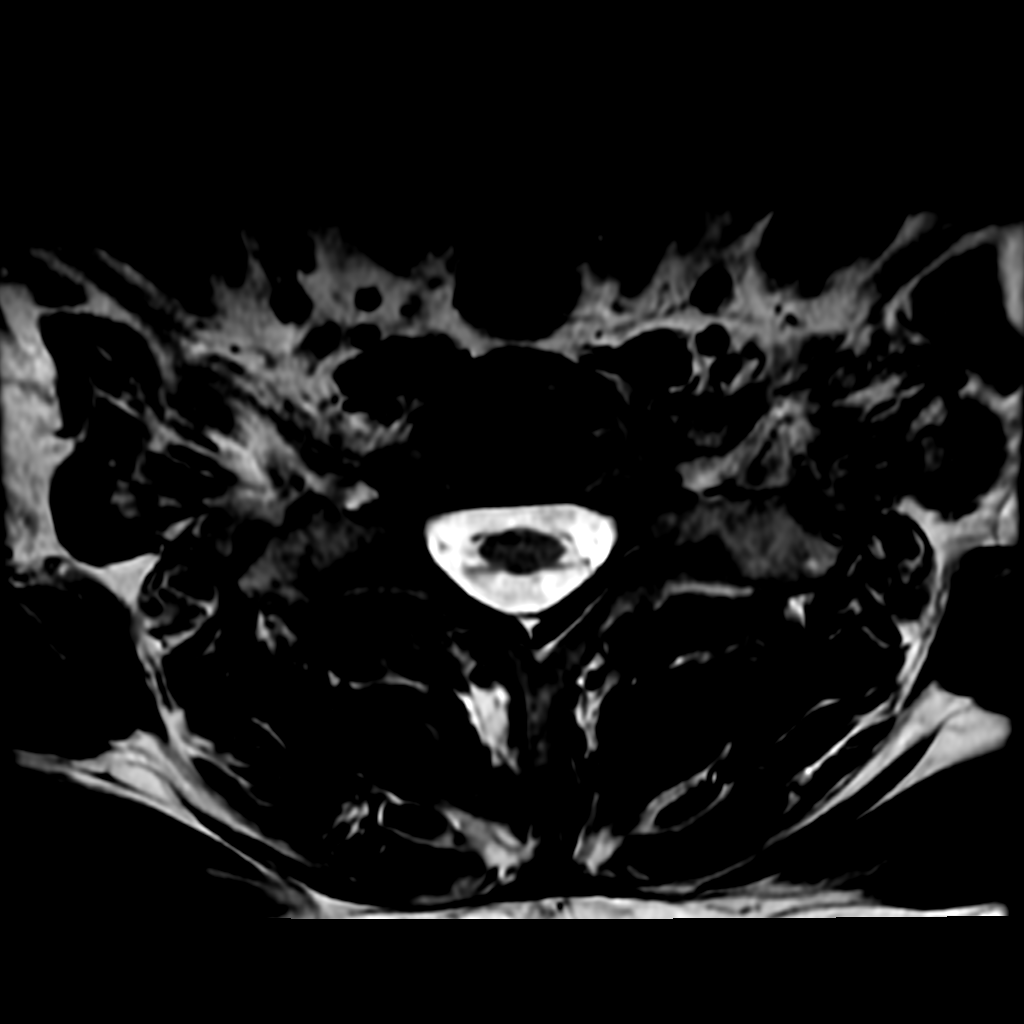
[im 9/18]
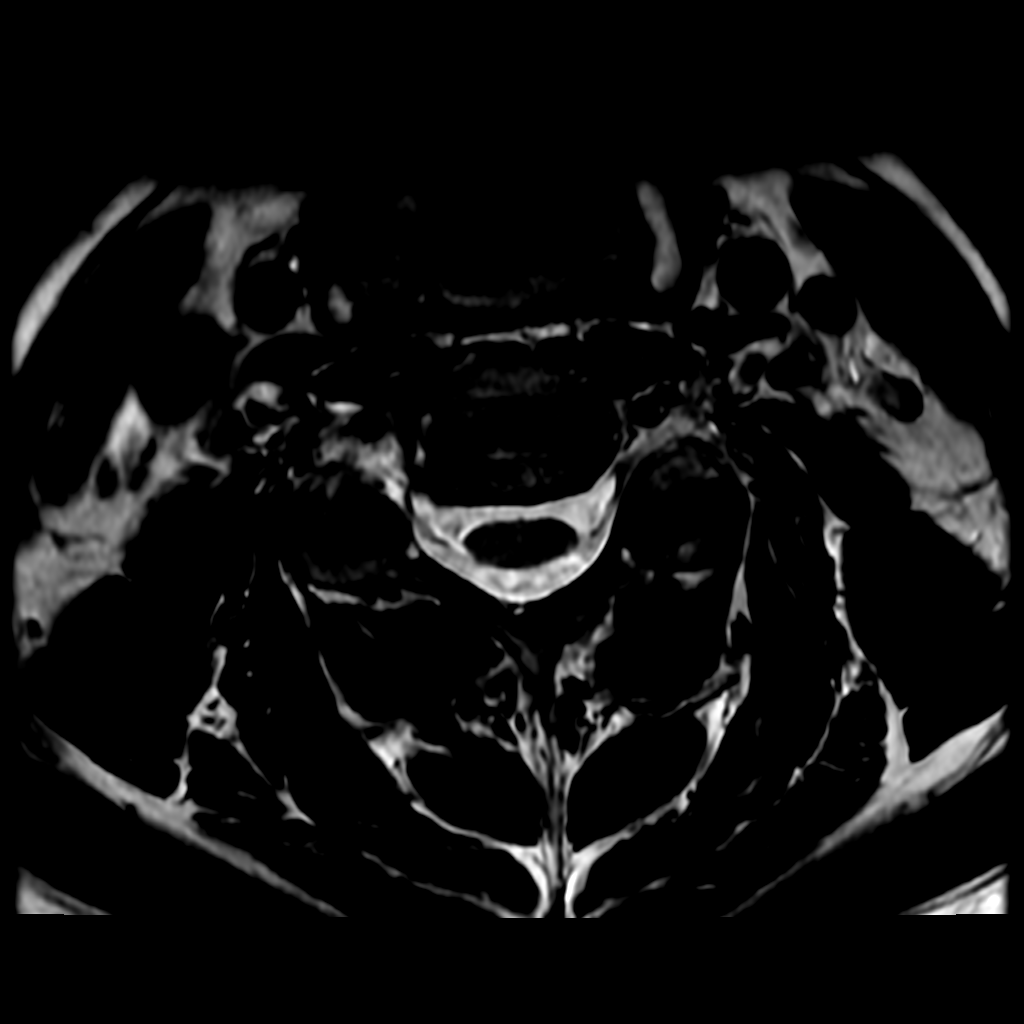
[im 18/18]
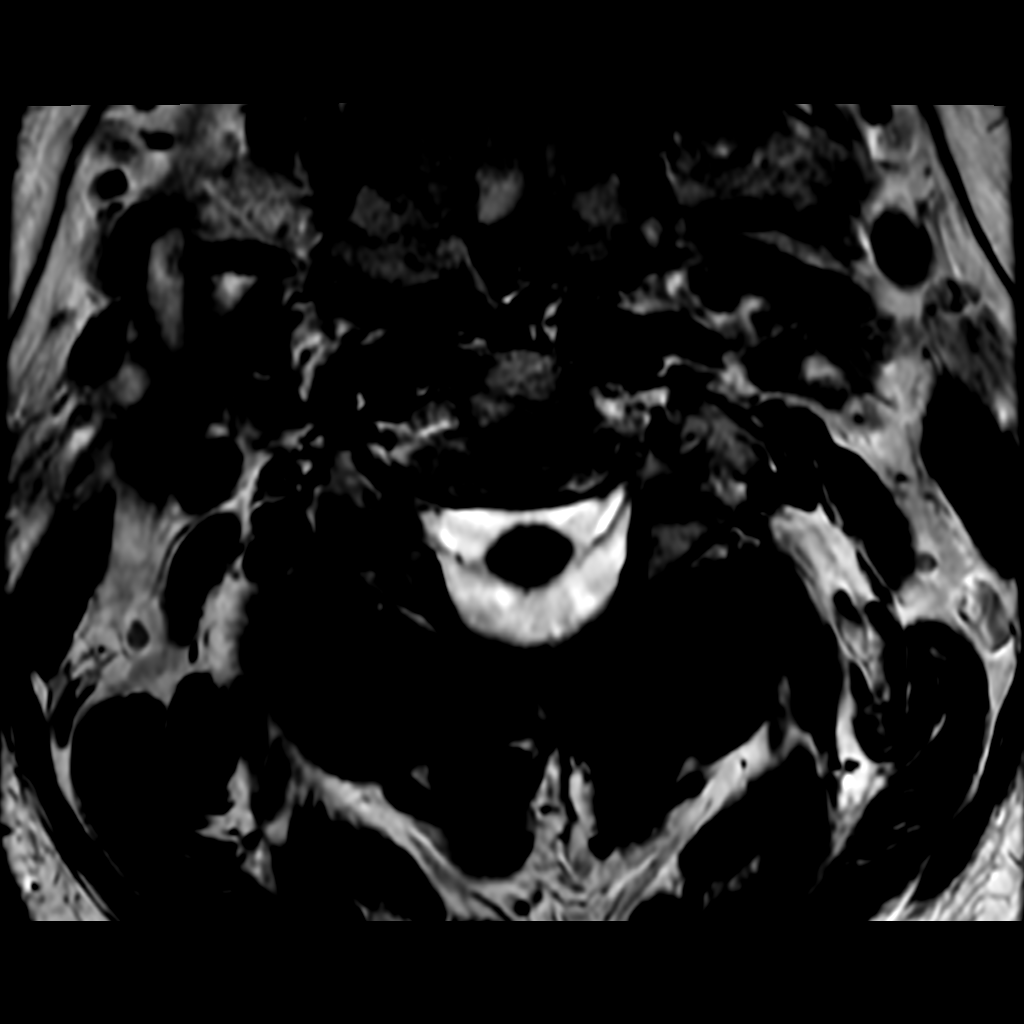

[5 of 48 positions shown; findings below may reference images not displayed]

FINDINGS: -------------------------------------------------------------------------------- 
----------------- 
GENERAL: 
ALIGNMENT: Trace grade 1 retrolisthesis C3 on C4. Otherwise anatomic. 
VERTEBRAL BODY HEIGHT: Normal.  
MARROW SIGNAL: No focal suspect signal abnormality. 
CORD SIGNAL: Normal.  
ADDITIONAL FINDINGS: Prominent soft tissue in the nasopharynx likely reflects a 
component of adenoidal hypertrophy.. There are nonenlarged left posterior 
triangle lymph nodes, see image 55 of series 12, measuring up to 7 mm. 
-------------------------------------------------------------------------------- 
---------------- 
SEGMENTAL: 
CRANIOCERVICAL JUNCTION: No significant stenosis. 
C2-C3: Mild loss of disc height. Canal and right foramina patent. Mild left 
foraminal narrowing. Normal facets. 
C3-C4: Mild loss of disc height. Canal and foramina are patent. Normal facets. 
C4-C5: Mild loss of disc height. Canal patent. There is moderate bilateral 
uncinate spurring leading to moderate right and left foraminal narrowing. Left 
greater than right facet arthropathy. 
C5-C6: Mild loss of disc height. Canal patent. Mild to moderate bilateral 
foraminal narrowing associated with uncinate spurring and facet arthropathy. 
C6-C7: Mild loss of disc height. Mild loss of disc height. There is a central 
disc extrusion with disc material extending superiorly and inferior endplate. It 
only measures 3.6 mm in AP dimension. It abuts the ventral cord margin with mild 
canal stenosis. The foramina are patent. Normal facets. 
C7-T1: Normal disc height. No herniation. Normal facets. No spinal canal or 
neural foraminal stenosis. 
Visualized upper thoracic segments are unremarkable. 
-------------------------------------------------------------------------------- 
---------------
IMPRESSION: Cervical spondylosis. Most significant findings are at C6-C7 where there is 
central disc extrusion that abuts the ventral cord margin and creates mild canal 
stenosis. 
C4-C5, moderate right and left foraminal narrowing. 
C5-C6, mild-to-moderate bilateral foraminal narrowing. 
Less significant degenerative degenerative changes as above.
